# Patient Record
Sex: Female | Born: 1965
Health system: Southern US, Community
[De-identification: ages and names within clinical notes are randomized; demographics above are authoritative.]

## PROBLEM LIST (undated history)

## (undated) DIAGNOSIS — C801 Malignant (primary) neoplasm, unspecified: Secondary | ICD-10-CM

## (undated) DIAGNOSIS — F32A Depression, unspecified: Secondary | ICD-10-CM

## (undated) MED FILL — Vinorelbine Tartrate Inj 50 MG/5ML (10 MG/ML) (Base Equiv): INTRAVENOUS | Qty: 4.5 | Status: AC

---

## 2015-07-19 DIAGNOSIS — C782 Secondary malignant neoplasm of pleura: Secondary | ICD-10-CM | POA: Diagnosis not present

## 2015-07-19 DIAGNOSIS — C50911 Malignant neoplasm of unspecified site of right female breast: Secondary | ICD-10-CM | POA: Diagnosis not present

## 2015-07-19 DIAGNOSIS — C779 Secondary and unspecified malignant neoplasm of lymph node, unspecified: Secondary | ICD-10-CM | POA: Diagnosis not present

## 2015-07-19 DIAGNOSIS — J910 Malignant pleural effusion: Secondary | ICD-10-CM | POA: Diagnosis not present

## 2015-08-16 DIAGNOSIS — C782 Secondary malignant neoplasm of pleura: Secondary | ICD-10-CM | POA: Diagnosis not present

## 2015-08-16 DIAGNOSIS — J910 Malignant pleural effusion: Secondary | ICD-10-CM | POA: Diagnosis not present

## 2015-08-16 DIAGNOSIS — C50911 Malignant neoplasm of unspecified site of right female breast: Secondary | ICD-10-CM | POA: Diagnosis not present

## 2015-11-22 DIAGNOSIS — M79672 Pain in left foot: Secondary | ICD-10-CM

## 2015-11-22 DIAGNOSIS — C779 Secondary and unspecified malignant neoplasm of lymph node, unspecified: Secondary | ICD-10-CM | POA: Diagnosis not present

## 2015-11-22 DIAGNOSIS — G620 Drug-induced polyneuropathy: Secondary | ICD-10-CM

## 2015-11-22 DIAGNOSIS — M79675 Pain in left toe(s): Secondary | ICD-10-CM

## 2015-11-22 DIAGNOSIS — M25572 Pain in left ankle and joints of left foot: Secondary | ICD-10-CM

## 2015-11-22 DIAGNOSIS — G8929 Other chronic pain: Secondary | ICD-10-CM

## 2015-11-22 DIAGNOSIS — M25571 Pain in right ankle and joints of right foot: Secondary | ICD-10-CM

## 2015-11-22 DIAGNOSIS — M79671 Pain in right foot: Secondary | ICD-10-CM

## 2015-11-22 DIAGNOSIS — M79674 Pain in right toe(s): Secondary | ICD-10-CM

## 2015-11-22 DIAGNOSIS — C782 Secondary malignant neoplasm of pleura: Secondary | ICD-10-CM | POA: Diagnosis not present

## 2015-11-22 DIAGNOSIS — C50919 Malignant neoplasm of unspecified site of unspecified female breast: Secondary | ICD-10-CM | POA: Diagnosis not present

## 2015-11-22 DIAGNOSIS — D701 Agranulocytosis secondary to cancer chemotherapy: Secondary | ICD-10-CM

## 2015-11-22 DIAGNOSIS — J910 Malignant pleural effusion: Secondary | ICD-10-CM | POA: Diagnosis not present

## 2015-12-21 DIAGNOSIS — Z853 Personal history of malignant neoplasm of breast: Secondary | ICD-10-CM | POA: Diagnosis not present

## 2015-12-21 DIAGNOSIS — C779 Secondary and unspecified malignant neoplasm of lymph node, unspecified: Secondary | ICD-10-CM | POA: Diagnosis not present

## 2015-12-21 DIAGNOSIS — E876 Hypokalemia: Secondary | ICD-10-CM | POA: Diagnosis not present

## 2015-12-21 DIAGNOSIS — J910 Malignant pleural effusion: Secondary | ICD-10-CM | POA: Diagnosis not present

## 2016-01-19 DIAGNOSIS — C50911 Malignant neoplasm of unspecified site of right female breast: Secondary | ICD-10-CM | POA: Diagnosis not present

## 2016-04-13 DIAGNOSIS — G620 Drug-induced polyneuropathy: Secondary | ICD-10-CM | POA: Diagnosis not present

## 2016-04-13 DIAGNOSIS — D72829 Elevated white blood cell count, unspecified: Secondary | ICD-10-CM | POA: Diagnosis not present

## 2016-04-13 DIAGNOSIS — F418 Other specified anxiety disorders: Secondary | ICD-10-CM

## 2016-04-13 DIAGNOSIS — D518 Other vitamin B12 deficiency anemias: Secondary | ICD-10-CM | POA: Diagnosis not present

## 2016-04-13 DIAGNOSIS — R7989 Other specified abnormal findings of blood chemistry: Secondary | ICD-10-CM

## 2016-04-13 DIAGNOSIS — C50911 Malignant neoplasm of unspecified site of right female breast: Secondary | ICD-10-CM | POA: Diagnosis not present

## 2016-07-20 DIAGNOSIS — E538 Deficiency of other specified B group vitamins: Secondary | ICD-10-CM | POA: Diagnosis not present

## 2016-07-20 DIAGNOSIS — C782 Secondary malignant neoplasm of pleura: Secondary | ICD-10-CM | POA: Diagnosis not present

## 2016-07-20 DIAGNOSIS — C50911 Malignant neoplasm of unspecified site of right female breast: Secondary | ICD-10-CM | POA: Diagnosis not present

## 2016-08-17 DIAGNOSIS — E538 Deficiency of other specified B group vitamins: Secondary | ICD-10-CM | POA: Diagnosis not present

## 2016-08-17 DIAGNOSIS — D709 Neutropenia, unspecified: Secondary | ICD-10-CM | POA: Diagnosis not present

## 2016-08-17 DIAGNOSIS — D72819 Decreased white blood cell count, unspecified: Secondary | ICD-10-CM | POA: Diagnosis not present

## 2016-08-17 DIAGNOSIS — Z853 Personal history of malignant neoplasm of breast: Secondary | ICD-10-CM | POA: Diagnosis not present

## 2016-10-23 DIAGNOSIS — E538 Deficiency of other specified B group vitamins: Secondary | ICD-10-CM | POA: Diagnosis not present

## 2016-11-13 DIAGNOSIS — C778 Secondary and unspecified malignant neoplasm of lymph nodes of multiple regions: Secondary | ICD-10-CM | POA: Diagnosis not present

## 2016-11-13 DIAGNOSIS — M8580 Other specified disorders of bone density and structure, unspecified site: Secondary | ICD-10-CM | POA: Diagnosis not present

## 2016-11-13 DIAGNOSIS — C782 Secondary malignant neoplasm of pleura: Secondary | ICD-10-CM | POA: Diagnosis not present

## 2016-11-13 DIAGNOSIS — E538 Deficiency of other specified B group vitamins: Secondary | ICD-10-CM | POA: Diagnosis not present

## 2016-11-13 DIAGNOSIS — E876 Hypokalemia: Secondary | ICD-10-CM | POA: Diagnosis not present

## 2016-11-13 DIAGNOSIS — C50411 Malignant neoplasm of upper-outer quadrant of right female breast: Secondary | ICD-10-CM | POA: Diagnosis not present

## 2016-11-13 DIAGNOSIS — F17210 Nicotine dependence, cigarettes, uncomplicated: Secondary | ICD-10-CM | POA: Diagnosis not present

## 2016-11-13 DIAGNOSIS — C7989 Secondary malignant neoplasm of other specified sites: Secondary | ICD-10-CM | POA: Diagnosis not present

## 2016-11-13 DIAGNOSIS — D708 Other neutropenia: Secondary | ICD-10-CM | POA: Diagnosis not present

## 2016-11-23 DIAGNOSIS — I10 Essential (primary) hypertension: Secondary | ICD-10-CM | POA: Diagnosis not present

## 2016-11-23 DIAGNOSIS — E039 Hypothyroidism, unspecified: Secondary | ICD-10-CM | POA: Diagnosis not present

## 2016-11-23 DIAGNOSIS — E1165 Type 2 diabetes mellitus with hyperglycemia: Secondary | ICD-10-CM | POA: Diagnosis not present

## 2016-11-23 DIAGNOSIS — E790 Hyperuricemia without signs of inflammatory arthritis and tophaceous disease: Secondary | ICD-10-CM | POA: Diagnosis not present

## 2016-11-23 DIAGNOSIS — E559 Vitamin D deficiency, unspecified: Secondary | ICD-10-CM | POA: Diagnosis not present

## 2016-11-23 DIAGNOSIS — E782 Mixed hyperlipidemia: Secondary | ICD-10-CM | POA: Diagnosis not present

## 2016-11-26 DIAGNOSIS — C384 Malignant neoplasm of pleura: Secondary | ICD-10-CM | POA: Diagnosis not present

## 2016-12-12 DIAGNOSIS — C50411 Malignant neoplasm of upper-outer quadrant of right female breast: Secondary | ICD-10-CM | POA: Diagnosis not present

## 2016-12-12 DIAGNOSIS — E538 Deficiency of other specified B group vitamins: Secondary | ICD-10-CM | POA: Diagnosis not present

## 2016-12-12 DIAGNOSIS — C782 Secondary malignant neoplasm of pleura: Secondary | ICD-10-CM | POA: Diagnosis not present

## 2016-12-12 DIAGNOSIS — C778 Secondary and unspecified malignant neoplasm of lymph nodes of multiple regions: Secondary | ICD-10-CM | POA: Diagnosis not present

## 2016-12-12 DIAGNOSIS — C7989 Secondary malignant neoplasm of other specified sites: Secondary | ICD-10-CM | POA: Diagnosis not present

## 2017-01-07 DIAGNOSIS — C782 Secondary malignant neoplasm of pleura: Secondary | ICD-10-CM | POA: Diagnosis not present

## 2017-01-07 DIAGNOSIS — E538 Deficiency of other specified B group vitamins: Secondary | ICD-10-CM | POA: Diagnosis not present

## 2017-01-07 DIAGNOSIS — C778 Secondary and unspecified malignant neoplasm of lymph nodes of multiple regions: Secondary | ICD-10-CM | POA: Diagnosis not present

## 2017-01-07 DIAGNOSIS — C7989 Secondary malignant neoplasm of other specified sites: Secondary | ICD-10-CM | POA: Diagnosis not present

## 2017-01-07 DIAGNOSIS — C50411 Malignant neoplasm of upper-outer quadrant of right female breast: Secondary | ICD-10-CM | POA: Diagnosis not present

## 2017-02-01 DIAGNOSIS — I700 Atherosclerosis of aorta: Secondary | ICD-10-CM | POA: Diagnosis not present

## 2017-02-01 DIAGNOSIS — C50411 Malignant neoplasm of upper-outer quadrant of right female breast: Secondary | ICD-10-CM | POA: Diagnosis not present

## 2017-02-01 DIAGNOSIS — J984 Other disorders of lung: Secondary | ICD-10-CM | POA: Diagnosis not present

## 2017-02-01 DIAGNOSIS — N281 Cyst of kidney, acquired: Secondary | ICD-10-CM | POA: Diagnosis not present

## 2017-02-01 DIAGNOSIS — C50919 Malignant neoplasm of unspecified site of unspecified female breast: Secondary | ICD-10-CM | POA: Diagnosis not present

## 2017-02-04 DIAGNOSIS — J329 Chronic sinusitis, unspecified: Secondary | ICD-10-CM | POA: Diagnosis not present

## 2017-02-04 DIAGNOSIS — D709 Neutropenia, unspecified: Secondary | ICD-10-CM | POA: Diagnosis not present

## 2017-02-04 DIAGNOSIS — D701 Agranulocytosis secondary to cancer chemotherapy: Secondary | ICD-10-CM | POA: Diagnosis not present

## 2017-02-04 DIAGNOSIS — E538 Deficiency of other specified B group vitamins: Secondary | ICD-10-CM | POA: Diagnosis not present

## 2017-02-04 DIAGNOSIS — J019 Acute sinusitis, unspecified: Secondary | ICD-10-CM | POA: Diagnosis not present

## 2017-02-04 DIAGNOSIS — C50411 Malignant neoplasm of upper-outer quadrant of right female breast: Secondary | ICD-10-CM | POA: Diagnosis not present

## 2017-02-04 DIAGNOSIS — F17210 Nicotine dependence, cigarettes, uncomplicated: Secondary | ICD-10-CM | POA: Diagnosis not present

## 2017-02-04 DIAGNOSIS — C778 Secondary and unspecified malignant neoplasm of lymph nodes of multiple regions: Secondary | ICD-10-CM | POA: Diagnosis not present

## 2017-02-04 DIAGNOSIS — C782 Secondary malignant neoplasm of pleura: Secondary | ICD-10-CM | POA: Diagnosis not present

## 2017-02-04 DIAGNOSIS — Z170 Estrogen receptor positive status [ER+]: Secondary | ICD-10-CM | POA: Diagnosis not present

## 2017-02-04 DIAGNOSIS — C7989 Secondary malignant neoplasm of other specified sites: Secondary | ICD-10-CM | POA: Diagnosis not present

## 2017-02-04 DIAGNOSIS — G8929 Other chronic pain: Secondary | ICD-10-CM | POA: Diagnosis not present

## 2017-02-04 DIAGNOSIS — C50919 Malignant neoplasm of unspecified site of unspecified female breast: Secondary | ICD-10-CM | POA: Diagnosis not present

## 2017-02-04 DIAGNOSIS — E876 Hypokalemia: Secondary | ICD-10-CM | POA: Diagnosis not present

## 2017-02-04 DIAGNOSIS — M8580 Other specified disorders of bone density and structure, unspecified site: Secondary | ICD-10-CM | POA: Diagnosis not present

## 2017-03-05 DIAGNOSIS — E538 Deficiency of other specified B group vitamins: Secondary | ICD-10-CM | POA: Diagnosis not present

## 2017-04-11 DIAGNOSIS — C50411 Malignant neoplasm of upper-outer quadrant of right female breast: Secondary | ICD-10-CM | POA: Diagnosis not present

## 2017-04-11 DIAGNOSIS — E538 Deficiency of other specified B group vitamins: Secondary | ICD-10-CM | POA: Diagnosis not present

## 2017-04-11 DIAGNOSIS — C7989 Secondary malignant neoplasm of other specified sites: Secondary | ICD-10-CM | POA: Diagnosis not present

## 2017-04-11 DIAGNOSIS — C782 Secondary malignant neoplasm of pleura: Secondary | ICD-10-CM | POA: Diagnosis not present

## 2017-04-11 DIAGNOSIS — C778 Secondary and unspecified malignant neoplasm of lymph nodes of multiple regions: Secondary | ICD-10-CM | POA: Diagnosis not present

## 2017-04-25 DIAGNOSIS — E1165 Type 2 diabetes mellitus with hyperglycemia: Secondary | ICD-10-CM | POA: Diagnosis not present

## 2017-04-25 DIAGNOSIS — E782 Mixed hyperlipidemia: Secondary | ICD-10-CM | POA: Diagnosis not present

## 2017-04-25 DIAGNOSIS — E559 Vitamin D deficiency, unspecified: Secondary | ICD-10-CM | POA: Diagnosis not present

## 2017-04-25 DIAGNOSIS — E039 Hypothyroidism, unspecified: Secondary | ICD-10-CM | POA: Diagnosis not present

## 2017-04-25 DIAGNOSIS — E790 Hyperuricemia without signs of inflammatory arthritis and tophaceous disease: Secondary | ICD-10-CM | POA: Diagnosis not present

## 2017-04-25 DIAGNOSIS — I10 Essential (primary) hypertension: Secondary | ICD-10-CM | POA: Diagnosis not present

## 2017-04-26 DIAGNOSIS — F419 Anxiety disorder, unspecified: Secondary | ICD-10-CM | POA: Diagnosis not present

## 2017-04-26 DIAGNOSIS — F332 Major depressive disorder, recurrent severe without psychotic features: Secondary | ICD-10-CM | POA: Diagnosis not present

## 2017-04-26 DIAGNOSIS — M79609 Pain in unspecified limb: Secondary | ICD-10-CM | POA: Diagnosis not present

## 2017-04-26 DIAGNOSIS — F339 Major depressive disorder, recurrent, unspecified: Secondary | ICD-10-CM | POA: Diagnosis not present

## 2017-04-29 DIAGNOSIS — E538 Deficiency of other specified B group vitamins: Secondary | ICD-10-CM | POA: Diagnosis not present

## 2017-04-29 DIAGNOSIS — C782 Secondary malignant neoplasm of pleura: Secondary | ICD-10-CM | POA: Diagnosis not present

## 2017-04-29 DIAGNOSIS — C50411 Malignant neoplasm of upper-outer quadrant of right female breast: Secondary | ICD-10-CM | POA: Diagnosis not present

## 2017-04-29 DIAGNOSIS — C7989 Secondary malignant neoplasm of other specified sites: Secondary | ICD-10-CM | POA: Diagnosis not present

## 2017-04-29 DIAGNOSIS — C778 Secondary and unspecified malignant neoplasm of lymph nodes of multiple regions: Secondary | ICD-10-CM | POA: Diagnosis not present

## 2017-06-05 DIAGNOSIS — C50411 Malignant neoplasm of upper-outer quadrant of right female breast: Secondary | ICD-10-CM | POA: Diagnosis not present

## 2017-06-05 DIAGNOSIS — E538 Deficiency of other specified B group vitamins: Secondary | ICD-10-CM | POA: Diagnosis not present

## 2017-06-05 DIAGNOSIS — C782 Secondary malignant neoplasm of pleura: Secondary | ICD-10-CM | POA: Diagnosis not present

## 2017-06-05 DIAGNOSIS — C7989 Secondary malignant neoplasm of other specified sites: Secondary | ICD-10-CM | POA: Diagnosis not present

## 2017-06-05 DIAGNOSIS — C778 Secondary and unspecified malignant neoplasm of lymph nodes of multiple regions: Secondary | ICD-10-CM | POA: Diagnosis not present

## 2017-07-03 DIAGNOSIS — C50411 Malignant neoplasm of upper-outer quadrant of right female breast: Secondary | ICD-10-CM | POA: Diagnosis not present

## 2017-07-03 DIAGNOSIS — E538 Deficiency of other specified B group vitamins: Secondary | ICD-10-CM | POA: Diagnosis not present

## 2017-07-03 DIAGNOSIS — C7989 Secondary malignant neoplasm of other specified sites: Secondary | ICD-10-CM | POA: Diagnosis not present

## 2017-07-03 DIAGNOSIS — C782 Secondary malignant neoplasm of pleura: Secondary | ICD-10-CM | POA: Diagnosis not present

## 2017-07-03 DIAGNOSIS — Z23 Encounter for immunization: Secondary | ICD-10-CM | POA: Diagnosis not present

## 2017-07-03 DIAGNOSIS — C778 Secondary and unspecified malignant neoplasm of lymph nodes of multiple regions: Secondary | ICD-10-CM | POA: Diagnosis not present

## 2017-07-19 DIAGNOSIS — C384 Malignant neoplasm of pleura: Secondary | ICD-10-CM | POA: Diagnosis not present

## 2017-07-19 DIAGNOSIS — K760 Fatty (change of) liver, not elsewhere classified: Secondary | ICD-10-CM | POA: Diagnosis not present

## 2017-07-19 DIAGNOSIS — C50911 Malignant neoplasm of unspecified site of right female breast: Secondary | ICD-10-CM | POA: Diagnosis not present

## 2017-07-19 DIAGNOSIS — C50411 Malignant neoplasm of upper-outer quadrant of right female breast: Secondary | ICD-10-CM | POA: Diagnosis not present

## 2017-07-22 DIAGNOSIS — D72818 Other decreased white blood cell count: Secondary | ICD-10-CM | POA: Diagnosis not present

## 2017-07-22 DIAGNOSIS — C782 Secondary malignant neoplasm of pleura: Secondary | ICD-10-CM | POA: Diagnosis not present

## 2017-07-22 DIAGNOSIS — C778 Secondary and unspecified malignant neoplasm of lymph nodes of multiple regions: Secondary | ICD-10-CM | POA: Diagnosis not present

## 2017-07-22 DIAGNOSIS — D709 Neutropenia, unspecified: Secondary | ICD-10-CM | POA: Diagnosis not present

## 2017-07-22 DIAGNOSIS — D708 Other neutropenia: Secondary | ICD-10-CM | POA: Diagnosis not present

## 2017-07-22 DIAGNOSIS — C779 Secondary and unspecified malignant neoplasm of lymph node, unspecified: Secondary | ICD-10-CM | POA: Diagnosis not present

## 2017-07-22 DIAGNOSIS — D72819 Decreased white blood cell count, unspecified: Secondary | ICD-10-CM | POA: Diagnosis not present

## 2017-07-22 DIAGNOSIS — C50411 Malignant neoplasm of upper-outer quadrant of right female breast: Secondary | ICD-10-CM | POA: Diagnosis not present

## 2017-07-22 DIAGNOSIS — Z853 Personal history of malignant neoplasm of breast: Secondary | ICD-10-CM | POA: Diagnosis not present

## 2017-07-22 DIAGNOSIS — E538 Deficiency of other specified B group vitamins: Secondary | ICD-10-CM | POA: Diagnosis not present

## 2017-08-15 DIAGNOSIS — E538 Deficiency of other specified B group vitamins: Secondary | ICD-10-CM | POA: Diagnosis not present

## 2017-08-26 DIAGNOSIS — E790 Hyperuricemia without signs of inflammatory arthritis and tophaceous disease: Secondary | ICD-10-CM | POA: Diagnosis not present

## 2017-08-26 DIAGNOSIS — E1165 Type 2 diabetes mellitus with hyperglycemia: Secondary | ICD-10-CM | POA: Diagnosis not present

## 2017-08-26 DIAGNOSIS — E039 Hypothyroidism, unspecified: Secondary | ICD-10-CM | POA: Diagnosis not present

## 2017-08-26 DIAGNOSIS — I10 Essential (primary) hypertension: Secondary | ICD-10-CM | POA: Diagnosis not present

## 2017-08-26 DIAGNOSIS — E559 Vitamin D deficiency, unspecified: Secondary | ICD-10-CM | POA: Diagnosis not present

## 2017-08-26 DIAGNOSIS — E782 Mixed hyperlipidemia: Secondary | ICD-10-CM | POA: Diagnosis not present

## 2017-09-02 DIAGNOSIS — J019 Acute sinusitis, unspecified: Secondary | ICD-10-CM | POA: Diagnosis not present

## 2017-09-02 DIAGNOSIS — F17200 Nicotine dependence, unspecified, uncomplicated: Secondary | ICD-10-CM | POA: Diagnosis not present

## 2017-09-02 DIAGNOSIS — D709 Neutropenia, unspecified: Secondary | ICD-10-CM | POA: Diagnosis not present

## 2017-09-02 DIAGNOSIS — Z515 Encounter for palliative care: Secondary | ICD-10-CM | POA: Diagnosis not present

## 2017-09-02 DIAGNOSIS — C778 Secondary and unspecified malignant neoplasm of lymph nodes of multiple regions: Secondary | ICD-10-CM | POA: Diagnosis not present

## 2017-09-02 DIAGNOSIS — C50411 Malignant neoplasm of upper-outer quadrant of right female breast: Secondary | ICD-10-CM | POA: Diagnosis not present

## 2017-09-02 DIAGNOSIS — C782 Secondary malignant neoplasm of pleura: Secondary | ICD-10-CM | POA: Diagnosis not present

## 2017-09-16 DIAGNOSIS — C7989 Secondary malignant neoplasm of other specified sites: Secondary | ICD-10-CM | POA: Diagnosis not present

## 2017-09-16 DIAGNOSIS — C782 Secondary malignant neoplasm of pleura: Secondary | ICD-10-CM | POA: Diagnosis not present

## 2017-09-16 DIAGNOSIS — E538 Deficiency of other specified B group vitamins: Secondary | ICD-10-CM | POA: Diagnosis not present

## 2017-09-16 DIAGNOSIS — C778 Secondary and unspecified malignant neoplasm of lymph nodes of multiple regions: Secondary | ICD-10-CM | POA: Diagnosis not present

## 2017-09-16 DIAGNOSIS — C50411 Malignant neoplasm of upper-outer quadrant of right female breast: Secondary | ICD-10-CM | POA: Diagnosis not present

## 2017-10-17 DIAGNOSIS — I10 Essential (primary) hypertension: Secondary | ICD-10-CM | POA: Diagnosis not present

## 2017-10-17 DIAGNOSIS — F17210 Nicotine dependence, cigarettes, uncomplicated: Secondary | ICD-10-CM | POA: Diagnosis not present

## 2017-10-17 DIAGNOSIS — Z5181 Encounter for therapeutic drug level monitoring: Secondary | ICD-10-CM | POA: Diagnosis not present

## 2017-10-17 DIAGNOSIS — F331 Major depressive disorder, recurrent, moderate: Secondary | ICD-10-CM | POA: Diagnosis not present

## 2017-10-17 DIAGNOSIS — F411 Generalized anxiety disorder: Secondary | ICD-10-CM | POA: Diagnosis not present

## 2017-10-31 DIAGNOSIS — C50411 Malignant neoplasm of upper-outer quadrant of right female breast: Secondary | ICD-10-CM | POA: Diagnosis not present

## 2017-10-31 DIAGNOSIS — C384 Malignant neoplasm of pleura: Secondary | ICD-10-CM | POA: Diagnosis not present

## 2017-11-25 DIAGNOSIS — I10 Essential (primary) hypertension: Secondary | ICD-10-CM | POA: Diagnosis not present

## 2017-11-25 DIAGNOSIS — F17210 Nicotine dependence, cigarettes, uncomplicated: Secondary | ICD-10-CM | POA: Diagnosis not present

## 2017-11-25 DIAGNOSIS — Z5181 Encounter for therapeutic drug level monitoring: Secondary | ICD-10-CM | POA: Diagnosis not present

## 2017-11-25 DIAGNOSIS — F411 Generalized anxiety disorder: Secondary | ICD-10-CM | POA: Diagnosis not present

## 2017-11-28 DIAGNOSIS — C50411 Malignant neoplasm of upper-outer quadrant of right female breast: Secondary | ICD-10-CM | POA: Diagnosis not present

## 2017-11-28 DIAGNOSIS — C782 Secondary malignant neoplasm of pleura: Secondary | ICD-10-CM | POA: Diagnosis not present

## 2017-11-28 DIAGNOSIS — E538 Deficiency of other specified B group vitamins: Secondary | ICD-10-CM | POA: Diagnosis not present

## 2017-11-28 DIAGNOSIS — C778 Secondary and unspecified malignant neoplasm of lymph nodes of multiple regions: Secondary | ICD-10-CM | POA: Diagnosis not present

## 2017-11-28 DIAGNOSIS — C7989 Secondary malignant neoplasm of other specified sites: Secondary | ICD-10-CM | POA: Diagnosis not present

## 2017-12-23 DIAGNOSIS — M8589 Other specified disorders of bone density and structure, multiple sites: Secondary | ICD-10-CM | POA: Diagnosis not present

## 2017-12-24 DIAGNOSIS — F411 Generalized anxiety disorder: Secondary | ICD-10-CM | POA: Diagnosis not present

## 2017-12-24 DIAGNOSIS — C801 Malignant (primary) neoplasm, unspecified: Secondary | ICD-10-CM | POA: Diagnosis not present

## 2017-12-24 DIAGNOSIS — I10 Essential (primary) hypertension: Secondary | ICD-10-CM | POA: Diagnosis not present

## 2017-12-24 DIAGNOSIS — Z5181 Encounter for therapeutic drug level monitoring: Secondary | ICD-10-CM | POA: Diagnosis not present

## 2017-12-26 DIAGNOSIS — C384 Malignant neoplasm of pleura: Secondary | ICD-10-CM | POA: Diagnosis not present

## 2017-12-26 DIAGNOSIS — C782 Secondary malignant neoplasm of pleura: Secondary | ICD-10-CM | POA: Diagnosis not present

## 2017-12-26 DIAGNOSIS — E782 Mixed hyperlipidemia: Secondary | ICD-10-CM | POA: Diagnosis not present

## 2017-12-26 DIAGNOSIS — Z130 Encounter for screening for diseases of the blood and blood-forming organs and certain disorders involving the immune mechanism: Secondary | ICD-10-CM | POA: Diagnosis not present

## 2017-12-26 DIAGNOSIS — C7989 Secondary malignant neoplasm of other specified sites: Secondary | ICD-10-CM | POA: Diagnosis not present

## 2017-12-26 DIAGNOSIS — E559 Vitamin D deficiency, unspecified: Secondary | ICD-10-CM | POA: Diagnosis not present

## 2017-12-26 DIAGNOSIS — C778 Secondary and unspecified malignant neoplasm of lymph nodes of multiple regions: Secondary | ICD-10-CM | POA: Diagnosis not present

## 2017-12-26 DIAGNOSIS — E538 Deficiency of other specified B group vitamins: Secondary | ICD-10-CM | POA: Diagnosis not present

## 2017-12-26 DIAGNOSIS — C50411 Malignant neoplasm of upper-outer quadrant of right female breast: Secondary | ICD-10-CM | POA: Diagnosis not present

## 2018-01-10 DIAGNOSIS — Z9011 Acquired absence of right breast and nipple: Secondary | ICD-10-CM | POA: Diagnosis not present

## 2018-01-10 DIAGNOSIS — R928 Other abnormal and inconclusive findings on diagnostic imaging of breast: Secondary | ICD-10-CM | POA: Diagnosis not present

## 2018-01-10 DIAGNOSIS — Z1231 Encounter for screening mammogram for malignant neoplasm of breast: Secondary | ICD-10-CM | POA: Diagnosis not present

## 2018-01-22 DIAGNOSIS — N6320 Unspecified lump in the left breast, unspecified quadrant: Secondary | ICD-10-CM | POA: Diagnosis not present

## 2018-01-22 DIAGNOSIS — N6489 Other specified disorders of breast: Secondary | ICD-10-CM | POA: Diagnosis not present

## 2018-01-23 DIAGNOSIS — Z5181 Encounter for therapeutic drug level monitoring: Secondary | ICD-10-CM | POA: Diagnosis not present

## 2018-01-23 DIAGNOSIS — F17210 Nicotine dependence, cigarettes, uncomplicated: Secondary | ICD-10-CM | POA: Diagnosis not present

## 2018-01-23 DIAGNOSIS — I10 Essential (primary) hypertension: Secondary | ICD-10-CM | POA: Diagnosis not present

## 2018-01-23 DIAGNOSIS — F411 Generalized anxiety disorder: Secondary | ICD-10-CM | POA: Diagnosis not present

## 2018-01-29 DIAGNOSIS — C782 Secondary malignant neoplasm of pleura: Secondary | ICD-10-CM | POA: Diagnosis not present

## 2018-01-29 DIAGNOSIS — C7989 Secondary malignant neoplasm of other specified sites: Secondary | ICD-10-CM | POA: Diagnosis not present

## 2018-01-29 DIAGNOSIS — E538 Deficiency of other specified B group vitamins: Secondary | ICD-10-CM | POA: Diagnosis not present

## 2018-01-29 DIAGNOSIS — C50411 Malignant neoplasm of upper-outer quadrant of right female breast: Secondary | ICD-10-CM | POA: Diagnosis not present

## 2018-01-29 DIAGNOSIS — C778 Secondary and unspecified malignant neoplasm of lymph nodes of multiple regions: Secondary | ICD-10-CM | POA: Diagnosis not present

## 2018-01-29 DIAGNOSIS — C384 Malignant neoplasm of pleura: Secondary | ICD-10-CM | POA: Diagnosis not present

## 2018-03-06 DIAGNOSIS — N6489 Other specified disorders of breast: Secondary | ICD-10-CM | POA: Diagnosis not present

## 2018-03-06 DIAGNOSIS — C778 Secondary and unspecified malignant neoplasm of lymph nodes of multiple regions: Secondary | ICD-10-CM | POA: Diagnosis not present

## 2018-03-06 DIAGNOSIS — C782 Secondary malignant neoplasm of pleura: Secondary | ICD-10-CM | POA: Diagnosis not present

## 2018-03-06 DIAGNOSIS — F17210 Nicotine dependence, cigarettes, uncomplicated: Secondary | ICD-10-CM | POA: Diagnosis not present

## 2018-03-06 DIAGNOSIS — B370 Candidal stomatitis: Secondary | ICD-10-CM | POA: Diagnosis not present

## 2018-03-06 DIAGNOSIS — Z720 Tobacco use: Secondary | ICD-10-CM | POA: Diagnosis not present

## 2018-03-06 DIAGNOSIS — E538 Deficiency of other specified B group vitamins: Secondary | ICD-10-CM | POA: Diagnosis not present

## 2018-03-06 DIAGNOSIS — C50911 Malignant neoplasm of unspecified site of right female breast: Secondary | ICD-10-CM | POA: Diagnosis not present

## 2018-03-06 DIAGNOSIS — M8580 Other specified disorders of bone density and structure, unspecified site: Secondary | ICD-10-CM | POA: Diagnosis not present

## 2018-03-06 DIAGNOSIS — C50411 Malignant neoplasm of upper-outer quadrant of right female breast: Secondary | ICD-10-CM | POA: Diagnosis not present

## 2018-03-06 DIAGNOSIS — Z79811 Long term (current) use of aromatase inhibitors: Secondary | ICD-10-CM | POA: Diagnosis not present

## 2018-03-06 DIAGNOSIS — D701 Agranulocytosis secondary to cancer chemotherapy: Secondary | ICD-10-CM | POA: Diagnosis not present

## 2018-03-06 DIAGNOSIS — C7989 Secondary malignant neoplasm of other specified sites: Secondary | ICD-10-CM | POA: Diagnosis not present

## 2018-03-06 DIAGNOSIS — M8589 Other specified disorders of bone density and structure, multiple sites: Secondary | ICD-10-CM | POA: Diagnosis not present

## 2018-03-06 DIAGNOSIS — D72819 Decreased white blood cell count, unspecified: Secondary | ICD-10-CM | POA: Diagnosis not present

## 2018-03-06 DIAGNOSIS — E876 Hypokalemia: Secondary | ICD-10-CM | POA: Diagnosis not present

## 2018-03-06 DIAGNOSIS — F418 Other specified anxiety disorders: Secondary | ICD-10-CM | POA: Diagnosis not present

## 2018-04-08 DIAGNOSIS — C50411 Malignant neoplasm of upper-outer quadrant of right female breast: Secondary | ICD-10-CM | POA: Diagnosis not present

## 2018-04-08 DIAGNOSIS — E538 Deficiency of other specified B group vitamins: Secondary | ICD-10-CM | POA: Diagnosis not present

## 2018-06-03 DIAGNOSIS — E538 Deficiency of other specified B group vitamins: Secondary | ICD-10-CM | POA: Diagnosis not present

## 2018-06-03 DIAGNOSIS — C50411 Malignant neoplasm of upper-outer quadrant of right female breast: Secondary | ICD-10-CM | POA: Diagnosis not present

## 2018-07-07 DIAGNOSIS — F418 Other specified anxiety disorders: Secondary | ICD-10-CM | POA: Diagnosis not present

## 2018-07-07 DIAGNOSIS — F17210 Nicotine dependence, cigarettes, uncomplicated: Secondary | ICD-10-CM | POA: Diagnosis not present

## 2018-07-07 DIAGNOSIS — R002 Palpitations: Secondary | ICD-10-CM | POA: Diagnosis not present

## 2018-07-07 DIAGNOSIS — F419 Anxiety disorder, unspecified: Secondary | ICD-10-CM | POA: Diagnosis not present

## 2018-07-07 DIAGNOSIS — R000 Tachycardia, unspecified: Secondary | ICD-10-CM | POA: Diagnosis not present

## 2018-07-09 DIAGNOSIS — E559 Vitamin D deficiency, unspecified: Secondary | ICD-10-CM | POA: Diagnosis not present

## 2018-07-09 DIAGNOSIS — E785 Hyperlipidemia, unspecified: Secondary | ICD-10-CM | POA: Diagnosis not present

## 2018-07-09 DIAGNOSIS — I10 Essential (primary) hypertension: Secondary | ICD-10-CM | POA: Diagnosis not present

## 2018-07-09 DIAGNOSIS — K219 Gastro-esophageal reflux disease without esophagitis: Secondary | ICD-10-CM | POA: Diagnosis not present

## 2018-07-09 DIAGNOSIS — R5382 Chronic fatigue, unspecified: Secondary | ICD-10-CM | POA: Diagnosis not present

## 2018-07-09 DIAGNOSIS — Z23 Encounter for immunization: Secondary | ICD-10-CM | POA: Diagnosis not present

## 2018-07-14 DIAGNOSIS — F418 Other specified anxiety disorders: Secondary | ICD-10-CM | POA: Diagnosis not present

## 2018-07-14 DIAGNOSIS — F17210 Nicotine dependence, cigarettes, uncomplicated: Secondary | ICD-10-CM | POA: Diagnosis not present

## 2018-07-14 DIAGNOSIS — C778 Secondary and unspecified malignant neoplasm of lymph nodes of multiple regions: Secondary | ICD-10-CM | POA: Diagnosis not present

## 2018-07-14 DIAGNOSIS — I10 Essential (primary) hypertension: Secondary | ICD-10-CM | POA: Diagnosis not present

## 2018-07-14 DIAGNOSIS — C782 Secondary malignant neoplasm of pleura: Secondary | ICD-10-CM | POA: Diagnosis not present

## 2018-07-14 DIAGNOSIS — D708 Other neutropenia: Secondary | ICD-10-CM | POA: Diagnosis not present

## 2018-07-14 DIAGNOSIS — D72819 Decreased white blood cell count, unspecified: Secondary | ICD-10-CM | POA: Diagnosis not present

## 2018-07-14 DIAGNOSIS — R978 Other abnormal tumor markers: Secondary | ICD-10-CM | POA: Diagnosis not present

## 2018-07-14 DIAGNOSIS — E538 Deficiency of other specified B group vitamins: Secondary | ICD-10-CM | POA: Diagnosis not present

## 2018-07-14 DIAGNOSIS — C50411 Malignant neoplasm of upper-outer quadrant of right female breast: Secondary | ICD-10-CM | POA: Diagnosis not present

## 2018-07-14 DIAGNOSIS — C7989 Secondary malignant neoplasm of other specified sites: Secondary | ICD-10-CM | POA: Diagnosis not present

## 2018-07-21 DIAGNOSIS — Z23 Encounter for immunization: Secondary | ICD-10-CM | POA: Diagnosis not present

## 2018-07-21 DIAGNOSIS — M791 Myalgia, unspecified site: Secondary | ICD-10-CM | POA: Diagnosis not present

## 2018-07-21 DIAGNOSIS — I10 Essential (primary) hypertension: Secondary | ICD-10-CM | POA: Diagnosis not present

## 2018-07-21 DIAGNOSIS — E559 Vitamin D deficiency, unspecified: Secondary | ICD-10-CM | POA: Diagnosis not present

## 2018-07-21 DIAGNOSIS — E785 Hyperlipidemia, unspecified: Secondary | ICD-10-CM | POA: Diagnosis not present

## 2018-07-23 DIAGNOSIS — N649 Disorder of breast, unspecified: Secondary | ICD-10-CM | POA: Diagnosis not present

## 2018-07-23 DIAGNOSIS — C50911 Malignant neoplasm of unspecified site of right female breast: Secondary | ICD-10-CM | POA: Diagnosis not present

## 2018-07-23 DIAGNOSIS — N6489 Other specified disorders of breast: Secondary | ICD-10-CM | POA: Diagnosis not present

## 2018-07-23 DIAGNOSIS — C50411 Malignant neoplasm of upper-outer quadrant of right female breast: Secondary | ICD-10-CM | POA: Diagnosis not present

## 2018-07-23 DIAGNOSIS — R978 Other abnormal tumor markers: Secondary | ICD-10-CM | POA: Diagnosis not present

## 2018-07-23 DIAGNOSIS — C50912 Malignant neoplasm of unspecified site of left female breast: Secondary | ICD-10-CM | POA: Diagnosis not present

## 2018-07-24 DIAGNOSIS — C50911 Malignant neoplasm of unspecified site of right female breast: Secondary | ICD-10-CM | POA: Diagnosis not present

## 2018-07-24 DIAGNOSIS — Z510 Encounter for antineoplastic radiation therapy: Secondary | ICD-10-CM | POA: Diagnosis not present

## 2018-07-24 DIAGNOSIS — C50411 Malignant neoplasm of upper-outer quadrant of right female breast: Secondary | ICD-10-CM | POA: Diagnosis not present

## 2018-07-29 DIAGNOSIS — Z79899 Other long term (current) drug therapy: Secondary | ICD-10-CM

## 2018-07-29 DIAGNOSIS — C7951 Secondary malignant neoplasm of bone: Secondary | ICD-10-CM | POA: Diagnosis not present

## 2018-07-29 DIAGNOSIS — C778 Secondary and unspecified malignant neoplasm of lymph nodes of multiple regions: Secondary | ICD-10-CM | POA: Diagnosis not present

## 2018-07-29 DIAGNOSIS — D72819 Decreased white blood cell count, unspecified: Secondary | ICD-10-CM | POA: Diagnosis not present

## 2018-07-29 DIAGNOSIS — Z79811 Long term (current) use of aromatase inhibitors: Secondary | ICD-10-CM

## 2018-07-29 DIAGNOSIS — Z720 Tobacco use: Secondary | ICD-10-CM | POA: Diagnosis not present

## 2018-07-29 DIAGNOSIS — E538 Deficiency of other specified B group vitamins: Secondary | ICD-10-CM | POA: Diagnosis not present

## 2018-07-29 DIAGNOSIS — C50411 Malignant neoplasm of upper-outer quadrant of right female breast: Secondary | ICD-10-CM | POA: Diagnosis not present

## 2018-07-29 DIAGNOSIS — D701 Agranulocytosis secondary to cancer chemotherapy: Secondary | ICD-10-CM

## 2018-07-29 DIAGNOSIS — Z9221 Personal history of antineoplastic chemotherapy: Secondary | ICD-10-CM

## 2018-07-29 DIAGNOSIS — I10 Essential (primary) hypertension: Secondary | ICD-10-CM

## 2018-07-29 DIAGNOSIS — D708 Other neutropenia: Secondary | ICD-10-CM | POA: Diagnosis not present

## 2018-07-29 DIAGNOSIS — C782 Secondary malignant neoplasm of pleura: Secondary | ICD-10-CM | POA: Diagnosis not present

## 2018-07-29 DIAGNOSIS — Z923 Personal history of irradiation: Secondary | ICD-10-CM

## 2018-07-29 DIAGNOSIS — F17210 Nicotine dependence, cigarettes, uncomplicated: Secondary | ICD-10-CM | POA: Diagnosis not present

## 2018-07-29 DIAGNOSIS — R7989 Other specified abnormal findings of blood chemistry: Secondary | ICD-10-CM | POA: Diagnosis not present

## 2018-07-29 DIAGNOSIS — D72828 Other elevated white blood cell count: Secondary | ICD-10-CM

## 2018-07-29 DIAGNOSIS — F418 Other specified anxiety disorders: Secondary | ICD-10-CM

## 2018-07-29 DIAGNOSIS — C7989 Secondary malignant neoplasm of other specified sites: Secondary | ICD-10-CM | POA: Diagnosis not present

## 2018-07-29 DIAGNOSIS — C50911 Malignant neoplasm of unspecified site of right female breast: Secondary | ICD-10-CM | POA: Diagnosis not present

## 2018-08-05 DIAGNOSIS — C778 Secondary and unspecified malignant neoplasm of lymph nodes of multiple regions: Secondary | ICD-10-CM | POA: Diagnosis not present

## 2018-08-05 DIAGNOSIS — C7989 Secondary malignant neoplasm of other specified sites: Secondary | ICD-10-CM | POA: Diagnosis not present

## 2018-08-05 DIAGNOSIS — C50411 Malignant neoplasm of upper-outer quadrant of right female breast: Secondary | ICD-10-CM | POA: Diagnosis not present

## 2018-08-11 DIAGNOSIS — Z9221 Personal history of antineoplastic chemotherapy: Secondary | ICD-10-CM

## 2018-08-11 DIAGNOSIS — Z923 Personal history of irradiation: Secondary | ICD-10-CM | POA: Diagnosis not present

## 2018-08-11 DIAGNOSIS — C778 Secondary and unspecified malignant neoplasm of lymph nodes of multiple regions: Secondary | ICD-10-CM | POA: Diagnosis not present

## 2018-08-11 DIAGNOSIS — M8589 Other specified disorders of bone density and structure, multiple sites: Secondary | ICD-10-CM | POA: Diagnosis not present

## 2018-08-11 DIAGNOSIS — Z515 Encounter for palliative care: Secondary | ICD-10-CM | POA: Diagnosis not present

## 2018-08-11 DIAGNOSIS — F17210 Nicotine dependence, cigarettes, uncomplicated: Secondary | ICD-10-CM | POA: Diagnosis not present

## 2018-08-11 DIAGNOSIS — Z79899 Other long term (current) drug therapy: Secondary | ICD-10-CM | POA: Diagnosis not present

## 2018-08-11 DIAGNOSIS — Z79811 Long term (current) use of aromatase inhibitors: Secondary | ICD-10-CM | POA: Diagnosis not present

## 2018-08-11 DIAGNOSIS — Z720 Tobacco use: Secondary | ICD-10-CM

## 2018-08-11 DIAGNOSIS — I10 Essential (primary) hypertension: Secondary | ICD-10-CM | POA: Diagnosis not present

## 2018-08-11 DIAGNOSIS — C50411 Malignant neoplasm of upper-outer quadrant of right female breast: Secondary | ICD-10-CM | POA: Diagnosis not present

## 2018-08-11 DIAGNOSIS — R110 Nausea: Secondary | ICD-10-CM | POA: Diagnosis not present

## 2018-08-11 DIAGNOSIS — C7989 Secondary malignant neoplasm of other specified sites: Secondary | ICD-10-CM | POA: Diagnosis not present

## 2018-08-11 DIAGNOSIS — C50911 Malignant neoplasm of unspecified site of right female breast: Secondary | ICD-10-CM | POA: Diagnosis not present

## 2018-08-11 DIAGNOSIS — C7951 Secondary malignant neoplasm of bone: Secondary | ICD-10-CM | POA: Diagnosis not present

## 2018-08-11 DIAGNOSIS — C782 Secondary malignant neoplasm of pleura: Secondary | ICD-10-CM | POA: Diagnosis not present

## 2018-08-11 DIAGNOSIS — E538 Deficiency of other specified B group vitamins: Secondary | ICD-10-CM | POA: Diagnosis not present

## 2018-08-21 DIAGNOSIS — C7989 Secondary malignant neoplasm of other specified sites: Secondary | ICD-10-CM | POA: Diagnosis not present

## 2018-08-21 DIAGNOSIS — C782 Secondary malignant neoplasm of pleura: Secondary | ICD-10-CM | POA: Diagnosis not present

## 2018-08-21 DIAGNOSIS — C778 Secondary and unspecified malignant neoplasm of lymph nodes of multiple regions: Secondary | ICD-10-CM | POA: Diagnosis not present

## 2018-08-21 DIAGNOSIS — C50411 Malignant neoplasm of upper-outer quadrant of right female breast: Secondary | ICD-10-CM | POA: Diagnosis not present

## 2018-08-21 DIAGNOSIS — E538 Deficiency of other specified B group vitamins: Secondary | ICD-10-CM | POA: Diagnosis not present

## 2018-09-11 DIAGNOSIS — C384 Malignant neoplasm of pleura: Secondary | ICD-10-CM | POA: Diagnosis not present

## 2018-09-11 DIAGNOSIS — R197 Diarrhea, unspecified: Secondary | ICD-10-CM | POA: Diagnosis not present

## 2018-09-11 DIAGNOSIS — I10 Essential (primary) hypertension: Secondary | ICD-10-CM | POA: Diagnosis not present

## 2018-09-11 DIAGNOSIS — F17210 Nicotine dependence, cigarettes, uncomplicated: Secondary | ICD-10-CM | POA: Diagnosis not present

## 2018-09-11 DIAGNOSIS — C7951 Secondary malignant neoplasm of bone: Secondary | ICD-10-CM | POA: Diagnosis not present

## 2018-09-11 DIAGNOSIS — R110 Nausea: Secondary | ICD-10-CM | POA: Diagnosis not present

## 2018-09-11 DIAGNOSIS — R739 Hyperglycemia, unspecified: Secondary | ICD-10-CM | POA: Diagnosis not present

## 2018-09-11 DIAGNOSIS — C50411 Malignant neoplasm of upper-outer quadrant of right female breast: Secondary | ICD-10-CM | POA: Diagnosis not present

## 2018-09-11 DIAGNOSIS — E538 Deficiency of other specified B group vitamins: Secondary | ICD-10-CM | POA: Diagnosis not present

## 2018-09-11 DIAGNOSIS — R300 Dysuria: Secondary | ICD-10-CM | POA: Diagnosis not present

## 2018-09-11 DIAGNOSIS — C778 Secondary and unspecified malignant neoplasm of lymph nodes of multiple regions: Secondary | ICD-10-CM | POA: Diagnosis not present

## 2018-09-12 DIAGNOSIS — I10 Essential (primary) hypertension: Secondary | ICD-10-CM | POA: Diagnosis not present

## 2018-09-12 DIAGNOSIS — E785 Hyperlipidemia, unspecified: Secondary | ICD-10-CM | POA: Diagnosis not present

## 2018-09-12 DIAGNOSIS — M791 Myalgia, unspecified site: Secondary | ICD-10-CM | POA: Diagnosis not present

## 2018-09-12 DIAGNOSIS — E118 Type 2 diabetes mellitus with unspecified complications: Secondary | ICD-10-CM | POA: Diagnosis not present

## 2018-09-14 DIAGNOSIS — Z7984 Long term (current) use of oral hypoglycemic drugs: Secondary | ICD-10-CM | POA: Diagnosis not present

## 2018-09-14 DIAGNOSIS — R945 Abnormal results of liver function studies: Secondary | ICD-10-CM | POA: Diagnosis not present

## 2018-09-14 DIAGNOSIS — S2231XA Fracture of one rib, right side, initial encounter for closed fracture: Secondary | ICD-10-CM | POA: Diagnosis not present

## 2018-09-14 DIAGNOSIS — T424X2A Poisoning by benzodiazepines, intentional self-harm, initial encounter: Secondary | ICD-10-CM | POA: Diagnosis present

## 2018-09-14 DIAGNOSIS — C782 Secondary malignant neoplasm of pleura: Secondary | ICD-10-CM | POA: Diagnosis present

## 2018-09-14 DIAGNOSIS — T481X2A Poisoning by skeletal muscle relaxants [neuromuscular blocking agents], intentional self-harm, initial encounter: Secondary | ICD-10-CM | POA: Diagnosis not present

## 2018-09-14 DIAGNOSIS — C50911 Malignant neoplasm of unspecified site of right female breast: Secondary | ICD-10-CM | POA: Diagnosis not present

## 2018-09-14 DIAGNOSIS — Z9221 Personal history of antineoplastic chemotherapy: Secondary | ICD-10-CM | POA: Diagnosis not present

## 2018-09-14 DIAGNOSIS — C50919 Malignant neoplasm of unspecified site of unspecified female breast: Secondary | ICD-10-CM | POA: Diagnosis not present

## 2018-09-14 DIAGNOSIS — E1165 Type 2 diabetes mellitus with hyperglycemia: Secondary | ICD-10-CM | POA: Diagnosis present

## 2018-09-14 DIAGNOSIS — Z79899 Other long term (current) drug therapy: Secondary | ICD-10-CM | POA: Diagnosis not present

## 2018-09-14 DIAGNOSIS — F410 Panic disorder [episodic paroxysmal anxiety] without agoraphobia: Secondary | ICD-10-CM | POA: Diagnosis not present

## 2018-09-14 DIAGNOSIS — R1011 Right upper quadrant pain: Secondary | ICD-10-CM | POA: Diagnosis not present

## 2018-09-14 DIAGNOSIS — T50902A Poisoning by unspecified drugs, medicaments and biological substances, intentional self-harm, initial encounter: Secondary | ICD-10-CM | POA: Diagnosis not present

## 2018-09-14 DIAGNOSIS — Z716 Tobacco abuse counseling: Secondary | ICD-10-CM | POA: Diagnosis not present

## 2018-09-14 DIAGNOSIS — G92 Toxic encephalopathy: Secondary | ICD-10-CM | POA: Diagnosis present

## 2018-09-14 DIAGNOSIS — I10 Essential (primary) hypertension: Secondary | ICD-10-CM | POA: Diagnosis present

## 2018-09-14 DIAGNOSIS — T887XXA Unspecified adverse effect of drug or medicament, initial encounter: Secondary | ICD-10-CM | POA: Diagnosis not present

## 2018-09-14 DIAGNOSIS — R531 Weakness: Secondary | ICD-10-CM | POA: Diagnosis not present

## 2018-09-14 DIAGNOSIS — Z79811 Long term (current) use of aromatase inhibitors: Secondary | ICD-10-CM | POA: Diagnosis not present

## 2018-09-14 DIAGNOSIS — E876 Hypokalemia: Secondary | ICD-10-CM | POA: Diagnosis present

## 2018-09-14 DIAGNOSIS — Z0001 Encounter for general adult medical examination with abnormal findings: Secondary | ICD-10-CM | POA: Diagnosis not present

## 2018-09-14 DIAGNOSIS — S2232XA Fracture of one rib, left side, initial encounter for closed fracture: Secondary | ICD-10-CM | POA: Diagnosis not present

## 2018-09-14 DIAGNOSIS — Z853 Personal history of malignant neoplasm of breast: Secondary | ICD-10-CM | POA: Diagnosis not present

## 2018-09-14 DIAGNOSIS — R748 Abnormal levels of other serum enzymes: Secondary | ICD-10-CM | POA: Diagnosis present

## 2018-09-14 DIAGNOSIS — C7951 Secondary malignant neoplasm of bone: Secondary | ICD-10-CM | POA: Diagnosis present

## 2018-09-14 DIAGNOSIS — T1491XA Suicide attempt, initial encounter: Secondary | ICD-10-CM | POA: Diagnosis not present

## 2018-09-14 DIAGNOSIS — F418 Other specified anxiety disorders: Secondary | ICD-10-CM | POA: Diagnosis present

## 2018-09-14 DIAGNOSIS — C7800 Secondary malignant neoplasm of unspecified lung: Secondary | ICD-10-CM | POA: Diagnosis present

## 2018-09-14 DIAGNOSIS — Z923 Personal history of irradiation: Secondary | ICD-10-CM | POA: Diagnosis not present

## 2018-09-14 DIAGNOSIS — Z720 Tobacco use: Secondary | ICD-10-CM | POA: Diagnosis not present

## 2018-09-14 DIAGNOSIS — Z515 Encounter for palliative care: Secondary | ICD-10-CM | POA: Diagnosis not present

## 2018-09-14 DIAGNOSIS — T50904A Poisoning by unspecified drugs, medicaments and biological substances, undetermined, initial encounter: Secondary | ICD-10-CM | POA: Diagnosis not present

## 2018-09-14 DIAGNOSIS — F17210 Nicotine dependence, cigarettes, uncomplicated: Secondary | ICD-10-CM | POA: Diagnosis present

## 2018-09-15 DIAGNOSIS — Z923 Personal history of irradiation: Secondary | ICD-10-CM

## 2018-09-15 DIAGNOSIS — R1011 Right upper quadrant pain: Secondary | ICD-10-CM

## 2018-09-15 DIAGNOSIS — C782 Secondary malignant neoplasm of pleura: Secondary | ICD-10-CM

## 2018-09-15 DIAGNOSIS — Z9221 Personal history of antineoplastic chemotherapy: Secondary | ICD-10-CM

## 2018-09-15 DIAGNOSIS — E876 Hypokalemia: Secondary | ICD-10-CM

## 2018-09-15 DIAGNOSIS — Z720 Tobacco use: Secondary | ICD-10-CM

## 2018-09-15 DIAGNOSIS — R945 Abnormal results of liver function studies: Secondary | ICD-10-CM

## 2018-09-15 DIAGNOSIS — T1491XA Suicide attempt, initial encounter: Secondary | ICD-10-CM | POA: Diagnosis not present

## 2018-09-15 DIAGNOSIS — Z79899 Other long term (current) drug therapy: Secondary | ICD-10-CM

## 2018-09-15 DIAGNOSIS — F410 Panic disorder [episodic paroxysmal anxiety] without agoraphobia: Secondary | ICD-10-CM

## 2018-09-15 DIAGNOSIS — Z79811 Long term (current) use of aromatase inhibitors: Secondary | ICD-10-CM

## 2018-09-15 DIAGNOSIS — T50902A Poisoning by unspecified drugs, medicaments and biological substances, intentional self-harm, initial encounter: Secondary | ICD-10-CM | POA: Diagnosis not present

## 2018-09-15 DIAGNOSIS — C50911 Malignant neoplasm of unspecified site of right female breast: Secondary | ICD-10-CM | POA: Diagnosis not present

## 2018-09-15 DIAGNOSIS — C7951 Secondary malignant neoplasm of bone: Secondary | ICD-10-CM | POA: Diagnosis not present

## 2018-09-15 DIAGNOSIS — T424X2A Poisoning by benzodiazepines, intentional self-harm, initial encounter: Secondary | ICD-10-CM

## 2018-09-16 DIAGNOSIS — T50902A Poisoning by unspecified drugs, medicaments and biological substances, intentional self-harm, initial encounter: Secondary | ICD-10-CM | POA: Diagnosis not present

## 2018-09-16 DIAGNOSIS — T1491XA Suicide attempt, initial encounter: Secondary | ICD-10-CM | POA: Diagnosis not present

## 2018-09-17 DIAGNOSIS — Z720 Tobacco use: Secondary | ICD-10-CM

## 2018-09-17 DIAGNOSIS — Z79899 Other long term (current) drug therapy: Secondary | ICD-10-CM

## 2018-09-17 DIAGNOSIS — C782 Secondary malignant neoplasm of pleura: Secondary | ICD-10-CM

## 2018-09-17 DIAGNOSIS — C7800 Secondary malignant neoplasm of unspecified lung: Secondary | ICD-10-CM

## 2018-09-17 DIAGNOSIS — Z923 Personal history of irradiation: Secondary | ICD-10-CM

## 2018-09-17 DIAGNOSIS — Z9221 Personal history of antineoplastic chemotherapy: Secondary | ICD-10-CM

## 2018-09-17 DIAGNOSIS — T424X2A Poisoning by benzodiazepines, intentional self-harm, initial encounter: Secondary | ICD-10-CM

## 2018-09-17 DIAGNOSIS — T1491XA Suicide attempt, initial encounter: Secondary | ICD-10-CM | POA: Diagnosis not present

## 2018-09-17 DIAGNOSIS — C7951 Secondary malignant neoplasm of bone: Secondary | ICD-10-CM | POA: Diagnosis not present

## 2018-09-17 DIAGNOSIS — F410 Panic disorder [episodic paroxysmal anxiety] without agoraphobia: Secondary | ICD-10-CM

## 2018-09-17 DIAGNOSIS — Z79811 Long term (current) use of aromatase inhibitors: Secondary | ICD-10-CM

## 2018-09-17 DIAGNOSIS — T50902A Poisoning by unspecified drugs, medicaments and biological substances, intentional self-harm, initial encounter: Secondary | ICD-10-CM | POA: Diagnosis not present

## 2018-09-17 DIAGNOSIS — C50911 Malignant neoplasm of unspecified site of right female breast: Secondary | ICD-10-CM

## 2018-10-08 DIAGNOSIS — E559 Vitamin D deficiency, unspecified: Secondary | ICD-10-CM | POA: Diagnosis not present

## 2018-10-08 DIAGNOSIS — I10 Essential (primary) hypertension: Secondary | ICD-10-CM | POA: Diagnosis not present

## 2018-10-08 DIAGNOSIS — E785 Hyperlipidemia, unspecified: Secondary | ICD-10-CM | POA: Diagnosis not present

## 2018-10-08 DIAGNOSIS — M791 Myalgia, unspecified site: Secondary | ICD-10-CM | POA: Diagnosis not present

## 2018-10-09 DIAGNOSIS — M8580 Other specified disorders of bone density and structure, unspecified site: Secondary | ICD-10-CM

## 2018-10-09 DIAGNOSIS — Z720 Tobacco use: Secondary | ICD-10-CM

## 2018-10-09 DIAGNOSIS — C778 Secondary and unspecified malignant neoplasm of lymph nodes of multiple regions: Secondary | ICD-10-CM | POA: Diagnosis not present

## 2018-10-09 DIAGNOSIS — C50911 Malignant neoplasm of unspecified site of right female breast: Secondary | ICD-10-CM | POA: Diagnosis not present

## 2018-10-09 DIAGNOSIS — F418 Other specified anxiety disorders: Secondary | ICD-10-CM

## 2018-10-09 DIAGNOSIS — Z9221 Personal history of antineoplastic chemotherapy: Secondary | ICD-10-CM | POA: Diagnosis not present

## 2018-10-09 DIAGNOSIS — C7951 Secondary malignant neoplasm of bone: Secondary | ICD-10-CM | POA: Diagnosis not present

## 2018-10-09 DIAGNOSIS — C7989 Secondary malignant neoplasm of other specified sites: Secondary | ICD-10-CM | POA: Diagnosis not present

## 2018-10-09 DIAGNOSIS — C50411 Malignant neoplasm of upper-outer quadrant of right female breast: Secondary | ICD-10-CM | POA: Diagnosis not present

## 2018-10-09 DIAGNOSIS — C782 Secondary malignant neoplasm of pleura: Secondary | ICD-10-CM | POA: Diagnosis not present

## 2018-10-09 DIAGNOSIS — Z923 Personal history of irradiation: Secondary | ICD-10-CM | POA: Diagnosis not present

## 2018-10-09 DIAGNOSIS — I10 Essential (primary) hypertension: Secondary | ICD-10-CM

## 2018-10-09 DIAGNOSIS — E538 Deficiency of other specified B group vitamins: Secondary | ICD-10-CM

## 2018-10-23 DIAGNOSIS — E559 Vitamin D deficiency, unspecified: Secondary | ICD-10-CM | POA: Diagnosis not present

## 2018-10-23 DIAGNOSIS — Z1331 Encounter for screening for depression: Secondary | ICD-10-CM | POA: Diagnosis not present

## 2018-10-23 DIAGNOSIS — M791 Myalgia, unspecified site: Secondary | ICD-10-CM | POA: Diagnosis not present

## 2018-10-23 DIAGNOSIS — E785 Hyperlipidemia, unspecified: Secondary | ICD-10-CM | POA: Diagnosis not present

## 2018-10-23 DIAGNOSIS — F3341 Major depressive disorder, recurrent, in partial remission: Secondary | ICD-10-CM | POA: Diagnosis not present

## 2018-10-23 DIAGNOSIS — E118 Type 2 diabetes mellitus with unspecified complications: Secondary | ICD-10-CM | POA: Diagnosis not present

## 2018-10-23 DIAGNOSIS — I10 Essential (primary) hypertension: Secondary | ICD-10-CM | POA: Diagnosis not present

## 2018-11-06 DIAGNOSIS — Z716 Tobacco abuse counseling: Secondary | ICD-10-CM | POA: Diagnosis not present

## 2018-11-06 DIAGNOSIS — M8580 Other specified disorders of bone density and structure, unspecified site: Secondary | ICD-10-CM | POA: Diagnosis not present

## 2018-11-06 DIAGNOSIS — C778 Secondary and unspecified malignant neoplasm of lymph nodes of multiple regions: Secondary | ICD-10-CM | POA: Diagnosis not present

## 2018-11-06 DIAGNOSIS — Z923 Personal history of irradiation: Secondary | ICD-10-CM | POA: Diagnosis not present

## 2018-11-06 DIAGNOSIS — Z853 Personal history of malignant neoplasm of breast: Secondary | ICD-10-CM | POA: Diagnosis not present

## 2018-11-06 DIAGNOSIS — C50411 Malignant neoplasm of upper-outer quadrant of right female breast: Secondary | ICD-10-CM | POA: Diagnosis not present

## 2018-11-06 DIAGNOSIS — M8589 Other specified disorders of bone density and structure, multiple sites: Secondary | ICD-10-CM | POA: Diagnosis not present

## 2018-11-06 DIAGNOSIS — C782 Secondary malignant neoplasm of pleura: Secondary | ICD-10-CM | POA: Diagnosis not present

## 2018-11-06 DIAGNOSIS — E538 Deficiency of other specified B group vitamins: Secondary | ICD-10-CM

## 2018-11-06 DIAGNOSIS — Z9221 Personal history of antineoplastic chemotherapy: Secondary | ICD-10-CM | POA: Diagnosis not present

## 2018-11-06 DIAGNOSIS — R945 Abnormal results of liver function studies: Secondary | ICD-10-CM

## 2018-11-06 DIAGNOSIS — C7989 Secondary malignant neoplasm of other specified sites: Secondary | ICD-10-CM | POA: Diagnosis not present

## 2018-11-06 DIAGNOSIS — C7951 Secondary malignant neoplasm of bone: Secondary | ICD-10-CM | POA: Diagnosis not present

## 2018-11-06 DIAGNOSIS — F418 Other specified anxiety disorders: Secondary | ICD-10-CM | POA: Diagnosis not present

## 2018-11-13 DIAGNOSIS — M791 Myalgia, unspecified site: Secondary | ICD-10-CM | POA: Diagnosis not present

## 2018-11-13 DIAGNOSIS — E559 Vitamin D deficiency, unspecified: Secondary | ICD-10-CM | POA: Diagnosis not present

## 2018-11-13 DIAGNOSIS — K219 Gastro-esophageal reflux disease without esophagitis: Secondary | ICD-10-CM | POA: Diagnosis not present

## 2018-11-13 DIAGNOSIS — F3341 Major depressive disorder, recurrent, in partial remission: Secondary | ICD-10-CM | POA: Diagnosis not present

## 2018-12-02 DIAGNOSIS — C50911 Malignant neoplasm of unspecified site of right female breast: Secondary | ICD-10-CM | POA: Diagnosis not present

## 2018-12-02 DIAGNOSIS — C7951 Secondary malignant neoplasm of bone: Secondary | ICD-10-CM | POA: Diagnosis not present

## 2018-12-02 DIAGNOSIS — C7800 Secondary malignant neoplasm of unspecified lung: Secondary | ICD-10-CM | POA: Diagnosis not present

## 2018-12-02 DIAGNOSIS — C787 Secondary malignant neoplasm of liver and intrahepatic bile duct: Secondary | ICD-10-CM | POA: Diagnosis not present

## 2018-12-02 DIAGNOSIS — C50411 Malignant neoplasm of upper-outer quadrant of right female breast: Secondary | ICD-10-CM | POA: Diagnosis not present

## 2018-12-04 DIAGNOSIS — C384 Malignant neoplasm of pleura: Secondary | ICD-10-CM | POA: Diagnosis not present

## 2018-12-04 DIAGNOSIS — Z853 Personal history of malignant neoplasm of breast: Secondary | ICD-10-CM | POA: Diagnosis not present

## 2018-12-04 DIAGNOSIS — M8589 Other specified disorders of bone density and structure, multiple sites: Secondary | ICD-10-CM | POA: Diagnosis not present

## 2018-12-04 DIAGNOSIS — F17210 Nicotine dependence, cigarettes, uncomplicated: Secondary | ICD-10-CM | POA: Diagnosis not present

## 2018-12-04 DIAGNOSIS — C7951 Secondary malignant neoplasm of bone: Secondary | ICD-10-CM | POA: Diagnosis not present

## 2018-12-04 DIAGNOSIS — C50919 Malignant neoplasm of unspecified site of unspecified female breast: Secondary | ICD-10-CM | POA: Diagnosis not present

## 2018-12-04 DIAGNOSIS — E538 Deficiency of other specified B group vitamins: Secondary | ICD-10-CM | POA: Diagnosis not present

## 2018-12-04 DIAGNOSIS — R748 Abnormal levels of other serum enzymes: Secondary | ICD-10-CM | POA: Diagnosis not present

## 2018-12-16 DIAGNOSIS — K219 Gastro-esophageal reflux disease without esophagitis: Secondary | ICD-10-CM | POA: Diagnosis not present

## 2018-12-16 DIAGNOSIS — E559 Vitamin D deficiency, unspecified: Secondary | ICD-10-CM | POA: Diagnosis not present

## 2018-12-16 DIAGNOSIS — F419 Anxiety disorder, unspecified: Secondary | ICD-10-CM | POA: Diagnosis not present

## 2018-12-16 DIAGNOSIS — M791 Myalgia, unspecified site: Secondary | ICD-10-CM | POA: Diagnosis not present

## 2019-01-01 DIAGNOSIS — E538 Deficiency of other specified B group vitamins: Secondary | ICD-10-CM | POA: Diagnosis not present

## 2019-01-01 DIAGNOSIS — R978 Other abnormal tumor markers: Secondary | ICD-10-CM | POA: Diagnosis not present

## 2019-01-01 DIAGNOSIS — C7989 Secondary malignant neoplasm of other specified sites: Secondary | ICD-10-CM | POA: Diagnosis not present

## 2019-01-01 DIAGNOSIS — C778 Secondary and unspecified malignant neoplasm of lymph nodes of multiple regions: Secondary | ICD-10-CM | POA: Diagnosis not present

## 2019-01-01 DIAGNOSIS — C50411 Malignant neoplasm of upper-outer quadrant of right female breast: Secondary | ICD-10-CM | POA: Diagnosis not present

## 2019-01-04 DIAGNOSIS — I10 Essential (primary) hypertension: Secondary | ICD-10-CM | POA: Diagnosis not present

## 2019-01-04 DIAGNOSIS — M542 Cervicalgia: Secondary | ICD-10-CM | POA: Diagnosis not present

## 2019-01-04 DIAGNOSIS — R52 Pain, unspecified: Secondary | ICD-10-CM | POA: Diagnosis not present

## 2019-01-04 DIAGNOSIS — Z79899 Other long term (current) drug therapy: Secondary | ICD-10-CM | POA: Diagnosis not present

## 2019-01-04 DIAGNOSIS — F17210 Nicotine dependence, cigarettes, uncomplicated: Secondary | ICD-10-CM | POA: Diagnosis not present

## 2019-01-05 DIAGNOSIS — C7952 Secondary malignant neoplasm of bone marrow: Secondary | ICD-10-CM | POA: Diagnosis not present

## 2019-01-05 DIAGNOSIS — C50411 Malignant neoplasm of upper-outer quadrant of right female breast: Secondary | ICD-10-CM | POA: Diagnosis not present

## 2019-01-07 DIAGNOSIS — C7951 Secondary malignant neoplasm of bone: Secondary | ICD-10-CM | POA: Diagnosis not present

## 2019-01-07 DIAGNOSIS — Z853 Personal history of malignant neoplasm of breast: Secondary | ICD-10-CM | POA: Diagnosis not present

## 2019-01-07 DIAGNOSIS — C384 Malignant neoplasm of pleura: Secondary | ICD-10-CM | POA: Diagnosis not present

## 2019-01-12 DIAGNOSIS — Z510 Encounter for antineoplastic radiation therapy: Secondary | ICD-10-CM | POA: Diagnosis not present

## 2019-01-12 DIAGNOSIS — C384 Malignant neoplasm of pleura: Secondary | ICD-10-CM | POA: Diagnosis not present

## 2019-01-13 DIAGNOSIS — E559 Vitamin D deficiency, unspecified: Secondary | ICD-10-CM | POA: Diagnosis not present

## 2019-01-13 DIAGNOSIS — F419 Anxiety disorder, unspecified: Secondary | ICD-10-CM | POA: Diagnosis not present

## 2019-01-13 DIAGNOSIS — C7951 Secondary malignant neoplasm of bone: Secondary | ICD-10-CM | POA: Diagnosis not present

## 2019-01-13 DIAGNOSIS — M791 Myalgia, unspecified site: Secondary | ICD-10-CM | POA: Diagnosis not present

## 2019-01-13 DIAGNOSIS — K219 Gastro-esophageal reflux disease without esophagitis: Secondary | ICD-10-CM | POA: Diagnosis not present

## 2019-01-14 DIAGNOSIS — Z510 Encounter for antineoplastic radiation therapy: Secondary | ICD-10-CM | POA: Diagnosis not present

## 2019-01-14 DIAGNOSIS — J910 Malignant pleural effusion: Secondary | ICD-10-CM | POA: Diagnosis not present

## 2019-01-14 DIAGNOSIS — C778 Secondary and unspecified malignant neoplasm of lymph nodes of multiple regions: Secondary | ICD-10-CM | POA: Diagnosis not present

## 2019-01-14 DIAGNOSIS — C7951 Secondary malignant neoplasm of bone: Secondary | ICD-10-CM | POA: Diagnosis not present

## 2019-01-14 DIAGNOSIS — C7989 Secondary malignant neoplasm of other specified sites: Secondary | ICD-10-CM | POA: Diagnosis not present

## 2019-01-14 DIAGNOSIS — C50411 Malignant neoplasm of upper-outer quadrant of right female breast: Secondary | ICD-10-CM | POA: Diagnosis not present

## 2019-01-14 DIAGNOSIS — E538 Deficiency of other specified B group vitamins: Secondary | ICD-10-CM | POA: Diagnosis not present

## 2019-01-14 DIAGNOSIS — C782 Secondary malignant neoplasm of pleura: Secondary | ICD-10-CM | POA: Diagnosis not present

## 2019-01-15 DIAGNOSIS — Z510 Encounter for antineoplastic radiation therapy: Secondary | ICD-10-CM | POA: Diagnosis not present

## 2019-01-15 DIAGNOSIS — C7951 Secondary malignant neoplasm of bone: Secondary | ICD-10-CM | POA: Diagnosis not present

## 2019-01-15 DIAGNOSIS — C7989 Secondary malignant neoplasm of other specified sites: Secondary | ICD-10-CM | POA: Diagnosis not present

## 2019-01-15 DIAGNOSIS — C50411 Malignant neoplasm of upper-outer quadrant of right female breast: Secondary | ICD-10-CM | POA: Diagnosis not present

## 2019-01-15 DIAGNOSIS — Z5111 Encounter for antineoplastic chemotherapy: Secondary | ICD-10-CM | POA: Diagnosis not present

## 2019-01-15 DIAGNOSIS — C778 Secondary and unspecified malignant neoplasm of lymph nodes of multiple regions: Secondary | ICD-10-CM | POA: Diagnosis not present

## 2019-01-15 DIAGNOSIS — C782 Secondary malignant neoplasm of pleura: Secondary | ICD-10-CM | POA: Diagnosis not present

## 2019-01-16 DIAGNOSIS — C50411 Malignant neoplasm of upper-outer quadrant of right female breast: Secondary | ICD-10-CM | POA: Diagnosis not present

## 2019-01-16 DIAGNOSIS — C7951 Secondary malignant neoplasm of bone: Secondary | ICD-10-CM | POA: Diagnosis not present

## 2019-01-16 DIAGNOSIS — Z510 Encounter for antineoplastic radiation therapy: Secondary | ICD-10-CM | POA: Diagnosis not present

## 2019-01-16 DIAGNOSIS — C778 Secondary and unspecified malignant neoplasm of lymph nodes of multiple regions: Secondary | ICD-10-CM | POA: Diagnosis not present

## 2019-01-16 DIAGNOSIS — C782 Secondary malignant neoplasm of pleura: Secondary | ICD-10-CM | POA: Diagnosis not present

## 2019-01-16 DIAGNOSIS — J910 Malignant pleural effusion: Secondary | ICD-10-CM | POA: Diagnosis not present

## 2019-01-16 DIAGNOSIS — C384 Malignant neoplasm of pleura: Secondary | ICD-10-CM | POA: Diagnosis not present

## 2019-01-16 DIAGNOSIS — C7989 Secondary malignant neoplasm of other specified sites: Secondary | ICD-10-CM | POA: Diagnosis not present

## 2019-01-19 DIAGNOSIS — J910 Malignant pleural effusion: Secondary | ICD-10-CM | POA: Diagnosis not present

## 2019-01-19 DIAGNOSIS — C384 Malignant neoplasm of pleura: Secondary | ICD-10-CM | POA: Diagnosis not present

## 2019-01-19 DIAGNOSIS — C778 Secondary and unspecified malignant neoplasm of lymph nodes of multiple regions: Secondary | ICD-10-CM | POA: Diagnosis not present

## 2019-01-19 DIAGNOSIS — C7951 Secondary malignant neoplasm of bone: Secondary | ICD-10-CM | POA: Diagnosis not present

## 2019-01-19 DIAGNOSIS — Z510 Encounter for antineoplastic radiation therapy: Secondary | ICD-10-CM | POA: Diagnosis not present

## 2019-01-19 DIAGNOSIS — C7989 Secondary malignant neoplasm of other specified sites: Secondary | ICD-10-CM | POA: Diagnosis not present

## 2019-01-19 DIAGNOSIS — C50411 Malignant neoplasm of upper-outer quadrant of right female breast: Secondary | ICD-10-CM | POA: Diagnosis not present

## 2019-01-20 DIAGNOSIS — C50411 Malignant neoplasm of upper-outer quadrant of right female breast: Secondary | ICD-10-CM | POA: Diagnosis not present

## 2019-01-20 DIAGNOSIS — C778 Secondary and unspecified malignant neoplasm of lymph nodes of multiple regions: Secondary | ICD-10-CM | POA: Diagnosis not present

## 2019-01-20 DIAGNOSIS — J910 Malignant pleural effusion: Secondary | ICD-10-CM | POA: Diagnosis not present

## 2019-01-20 DIAGNOSIS — C7989 Secondary malignant neoplasm of other specified sites: Secondary | ICD-10-CM | POA: Diagnosis not present

## 2019-01-20 DIAGNOSIS — C7951 Secondary malignant neoplasm of bone: Secondary | ICD-10-CM | POA: Diagnosis not present

## 2019-01-20 DIAGNOSIS — Z510 Encounter for antineoplastic radiation therapy: Secondary | ICD-10-CM | POA: Diagnosis not present

## 2019-01-20 DIAGNOSIS — C384 Malignant neoplasm of pleura: Secondary | ICD-10-CM | POA: Diagnosis not present

## 2019-01-21 DIAGNOSIS — C7989 Secondary malignant neoplasm of other specified sites: Secondary | ICD-10-CM | POA: Diagnosis not present

## 2019-01-21 DIAGNOSIS — C384 Malignant neoplasm of pleura: Secondary | ICD-10-CM | POA: Diagnosis not present

## 2019-01-21 DIAGNOSIS — Z510 Encounter for antineoplastic radiation therapy: Secondary | ICD-10-CM | POA: Diagnosis not present

## 2019-01-21 DIAGNOSIS — C50411 Malignant neoplasm of upper-outer quadrant of right female breast: Secondary | ICD-10-CM | POA: Diagnosis not present

## 2019-01-21 DIAGNOSIS — C7951 Secondary malignant neoplasm of bone: Secondary | ICD-10-CM | POA: Diagnosis not present

## 2019-01-21 DIAGNOSIS — J910 Malignant pleural effusion: Secondary | ICD-10-CM | POA: Diagnosis not present

## 2019-01-21 DIAGNOSIS — C778 Secondary and unspecified malignant neoplasm of lymph nodes of multiple regions: Secondary | ICD-10-CM | POA: Diagnosis not present

## 2019-01-22 DIAGNOSIS — C7951 Secondary malignant neoplasm of bone: Secondary | ICD-10-CM | POA: Diagnosis not present

## 2019-01-22 DIAGNOSIS — Z510 Encounter for antineoplastic radiation therapy: Secondary | ICD-10-CM | POA: Diagnosis not present

## 2019-01-22 DIAGNOSIS — C50411 Malignant neoplasm of upper-outer quadrant of right female breast: Secondary | ICD-10-CM | POA: Diagnosis not present

## 2019-01-22 DIAGNOSIS — C384 Malignant neoplasm of pleura: Secondary | ICD-10-CM | POA: Diagnosis not present

## 2019-01-22 DIAGNOSIS — J910 Malignant pleural effusion: Secondary | ICD-10-CM | POA: Diagnosis not present

## 2019-01-22 DIAGNOSIS — C778 Secondary and unspecified malignant neoplasm of lymph nodes of multiple regions: Secondary | ICD-10-CM | POA: Diagnosis not present

## 2019-01-22 DIAGNOSIS — C7989 Secondary malignant neoplasm of other specified sites: Secondary | ICD-10-CM | POA: Diagnosis not present

## 2019-01-23 DIAGNOSIS — C7951 Secondary malignant neoplasm of bone: Secondary | ICD-10-CM | POA: Diagnosis not present

## 2019-01-23 DIAGNOSIS — Z510 Encounter for antineoplastic radiation therapy: Secondary | ICD-10-CM | POA: Diagnosis not present

## 2019-01-23 DIAGNOSIS — C384 Malignant neoplasm of pleura: Secondary | ICD-10-CM | POA: Diagnosis not present

## 2019-01-23 DIAGNOSIS — C50411 Malignant neoplasm of upper-outer quadrant of right female breast: Secondary | ICD-10-CM | POA: Diagnosis not present

## 2019-01-23 DIAGNOSIS — C7989 Secondary malignant neoplasm of other specified sites: Secondary | ICD-10-CM | POA: Diagnosis not present

## 2019-01-23 DIAGNOSIS — C778 Secondary and unspecified malignant neoplasm of lymph nodes of multiple regions: Secondary | ICD-10-CM | POA: Diagnosis not present

## 2019-01-23 DIAGNOSIS — J910 Malignant pleural effusion: Secondary | ICD-10-CM | POA: Diagnosis not present

## 2019-01-26 DIAGNOSIS — C7989 Secondary malignant neoplasm of other specified sites: Secondary | ICD-10-CM | POA: Diagnosis not present

## 2019-01-26 DIAGNOSIS — C778 Secondary and unspecified malignant neoplasm of lymph nodes of multiple regions: Secondary | ICD-10-CM | POA: Diagnosis not present

## 2019-01-26 DIAGNOSIS — C384 Malignant neoplasm of pleura: Secondary | ICD-10-CM | POA: Diagnosis not present

## 2019-01-26 DIAGNOSIS — C7951 Secondary malignant neoplasm of bone: Secondary | ICD-10-CM | POA: Diagnosis not present

## 2019-01-26 DIAGNOSIS — Z510 Encounter for antineoplastic radiation therapy: Secondary | ICD-10-CM | POA: Diagnosis not present

## 2019-01-26 DIAGNOSIS — C50411 Malignant neoplasm of upper-outer quadrant of right female breast: Secondary | ICD-10-CM | POA: Diagnosis not present

## 2019-01-26 DIAGNOSIS — J910 Malignant pleural effusion: Secondary | ICD-10-CM | POA: Diagnosis not present

## 2019-01-27 DIAGNOSIS — C384 Malignant neoplasm of pleura: Secondary | ICD-10-CM | POA: Diagnosis not present

## 2019-01-27 DIAGNOSIS — J910 Malignant pleural effusion: Secondary | ICD-10-CM | POA: Diagnosis not present

## 2019-01-27 DIAGNOSIS — C7951 Secondary malignant neoplasm of bone: Secondary | ICD-10-CM | POA: Diagnosis not present

## 2019-01-27 DIAGNOSIS — C778 Secondary and unspecified malignant neoplasm of lymph nodes of multiple regions: Secondary | ICD-10-CM | POA: Diagnosis not present

## 2019-01-27 DIAGNOSIS — C7989 Secondary malignant neoplasm of other specified sites: Secondary | ICD-10-CM | POA: Diagnosis not present

## 2019-01-27 DIAGNOSIS — C50411 Malignant neoplasm of upper-outer quadrant of right female breast: Secondary | ICD-10-CM | POA: Diagnosis not present

## 2019-01-27 DIAGNOSIS — Z510 Encounter for antineoplastic radiation therapy: Secondary | ICD-10-CM | POA: Diagnosis not present

## 2019-02-02 DIAGNOSIS — C7951 Secondary malignant neoplasm of bone: Secondary | ICD-10-CM

## 2019-02-02 DIAGNOSIS — Z853 Personal history of malignant neoplasm of breast: Secondary | ICD-10-CM

## 2019-02-02 DIAGNOSIS — E538 Deficiency of other specified B group vitamins: Secondary | ICD-10-CM | POA: Diagnosis not present

## 2019-02-02 DIAGNOSIS — E876 Hypokalemia: Secondary | ICD-10-CM | POA: Diagnosis not present

## 2019-02-02 DIAGNOSIS — M8589 Other specified disorders of bone density and structure, multiple sites: Secondary | ICD-10-CM | POA: Diagnosis not present

## 2019-02-02 DIAGNOSIS — F17210 Nicotine dependence, cigarettes, uncomplicated: Secondary | ICD-10-CM | POA: Diagnosis not present

## 2019-02-02 DIAGNOSIS — R978 Other abnormal tumor markers: Secondary | ICD-10-CM | POA: Diagnosis not present

## 2019-02-02 DIAGNOSIS — C50411 Malignant neoplasm of upper-outer quadrant of right female breast: Secondary | ICD-10-CM | POA: Diagnosis not present

## 2019-02-16 DIAGNOSIS — K219 Gastro-esophageal reflux disease without esophagitis: Secondary | ICD-10-CM | POA: Diagnosis not present

## 2019-02-16 DIAGNOSIS — E559 Vitamin D deficiency, unspecified: Secondary | ICD-10-CM | POA: Diagnosis not present

## 2019-02-16 DIAGNOSIS — M791 Myalgia, unspecified site: Secondary | ICD-10-CM | POA: Diagnosis not present

## 2019-02-16 DIAGNOSIS — F419 Anxiety disorder, unspecified: Secondary | ICD-10-CM | POA: Diagnosis not present

## 2019-02-27 DIAGNOSIS — N289 Disorder of kidney and ureter, unspecified: Secondary | ICD-10-CM | POA: Diagnosis not present

## 2019-02-27 DIAGNOSIS — C50912 Malignant neoplasm of unspecified site of left female breast: Secondary | ICD-10-CM | POA: Diagnosis not present

## 2019-02-27 DIAGNOSIS — C7931 Secondary malignant neoplasm of brain: Secondary | ICD-10-CM | POA: Diagnosis not present

## 2019-02-27 DIAGNOSIS — C801 Malignant (primary) neoplasm, unspecified: Secondary | ICD-10-CM | POA: Diagnosis not present

## 2019-02-27 DIAGNOSIS — I700 Atherosclerosis of aorta: Secondary | ICD-10-CM | POA: Diagnosis not present

## 2019-02-27 DIAGNOSIS — I2510 Atherosclerotic heart disease of native coronary artery without angina pectoris: Secondary | ICD-10-CM | POA: Diagnosis not present

## 2019-02-27 DIAGNOSIS — Z95828 Presence of other vascular implants and grafts: Secondary | ICD-10-CM | POA: Diagnosis not present

## 2019-02-27 DIAGNOSIS — R1013 Epigastric pain: Secondary | ICD-10-CM | POA: Diagnosis not present

## 2019-02-27 DIAGNOSIS — R911 Solitary pulmonary nodule: Secondary | ICD-10-CM | POA: Diagnosis not present

## 2019-02-27 DIAGNOSIS — C7800 Secondary malignant neoplasm of unspecified lung: Secondary | ICD-10-CM | POA: Diagnosis not present

## 2019-02-27 DIAGNOSIS — C787 Secondary malignant neoplasm of liver and intrahepatic bile duct: Secondary | ICD-10-CM | POA: Diagnosis not present

## 2019-02-27 DIAGNOSIS — C50919 Malignant neoplasm of unspecified site of unspecified female breast: Secondary | ICD-10-CM | POA: Diagnosis not present

## 2019-02-27 DIAGNOSIS — M899 Disorder of bone, unspecified: Secondary | ICD-10-CM | POA: Diagnosis not present

## 2019-02-27 DIAGNOSIS — R509 Fever, unspecified: Secondary | ICD-10-CM | POA: Diagnosis not present

## 2019-02-27 DIAGNOSIS — C7951 Secondary malignant neoplasm of bone: Secondary | ICD-10-CM | POA: Diagnosis not present

## 2019-03-02 DIAGNOSIS — R978 Other abnormal tumor markers: Secondary | ICD-10-CM | POA: Diagnosis not present

## 2019-03-02 DIAGNOSIS — E538 Deficiency of other specified B group vitamins: Secondary | ICD-10-CM | POA: Diagnosis not present

## 2019-03-02 DIAGNOSIS — C778 Secondary and unspecified malignant neoplasm of lymph nodes of multiple regions: Secondary | ICD-10-CM | POA: Diagnosis not present

## 2019-03-02 DIAGNOSIS — C7989 Secondary malignant neoplasm of other specified sites: Secondary | ICD-10-CM | POA: Diagnosis not present

## 2019-03-02 DIAGNOSIS — C782 Secondary malignant neoplasm of pleura: Secondary | ICD-10-CM | POA: Diagnosis not present

## 2019-03-16 DIAGNOSIS — C7989 Secondary malignant neoplasm of other specified sites: Secondary | ICD-10-CM | POA: Diagnosis not present

## 2019-03-16 DIAGNOSIS — C778 Secondary and unspecified malignant neoplasm of lymph nodes of multiple regions: Secondary | ICD-10-CM | POA: Diagnosis not present

## 2019-03-16 DIAGNOSIS — C50411 Malignant neoplasm of upper-outer quadrant of right female breast: Secondary | ICD-10-CM | POA: Diagnosis not present

## 2019-03-17 DIAGNOSIS — M791 Myalgia, unspecified site: Secondary | ICD-10-CM | POA: Diagnosis not present

## 2019-03-17 DIAGNOSIS — F419 Anxiety disorder, unspecified: Secondary | ICD-10-CM | POA: Diagnosis not present

## 2019-03-17 DIAGNOSIS — E559 Vitamin D deficiency, unspecified: Secondary | ICD-10-CM | POA: Diagnosis not present

## 2019-03-17 DIAGNOSIS — K219 Gastro-esophageal reflux disease without esophagitis: Secondary | ICD-10-CM | POA: Diagnosis not present

## 2019-03-30 DIAGNOSIS — F17210 Nicotine dependence, cigarettes, uncomplicated: Secondary | ICD-10-CM | POA: Diagnosis not present

## 2019-03-30 DIAGNOSIS — C778 Secondary and unspecified malignant neoplasm of lymph nodes of multiple regions: Secondary | ICD-10-CM | POA: Diagnosis not present

## 2019-03-30 DIAGNOSIS — C7951 Secondary malignant neoplasm of bone: Secondary | ICD-10-CM | POA: Diagnosis not present

## 2019-03-30 DIAGNOSIS — R978 Other abnormal tumor markers: Secondary | ICD-10-CM | POA: Diagnosis not present

## 2019-03-30 DIAGNOSIS — C50411 Malignant neoplasm of upper-outer quadrant of right female breast: Secondary | ICD-10-CM | POA: Diagnosis not present

## 2019-03-30 DIAGNOSIS — C782 Secondary malignant neoplasm of pleura: Secondary | ICD-10-CM | POA: Diagnosis not present

## 2019-03-30 DIAGNOSIS — Z853 Personal history of malignant neoplasm of breast: Secondary | ICD-10-CM | POA: Diagnosis not present

## 2019-03-30 DIAGNOSIS — F418 Other specified anxiety disorders: Secondary | ICD-10-CM | POA: Diagnosis not present

## 2019-03-30 DIAGNOSIS — C7989 Secondary malignant neoplasm of other specified sites: Secondary | ICD-10-CM | POA: Diagnosis not present

## 2019-04-14 DIAGNOSIS — K219 Gastro-esophageal reflux disease without esophagitis: Secondary | ICD-10-CM | POA: Diagnosis not present

## 2019-04-14 DIAGNOSIS — F419 Anxiety disorder, unspecified: Secondary | ICD-10-CM | POA: Diagnosis not present

## 2019-04-14 DIAGNOSIS — E559 Vitamin D deficiency, unspecified: Secondary | ICD-10-CM | POA: Diagnosis not present

## 2019-04-14 DIAGNOSIS — M791 Myalgia, unspecified site: Secondary | ICD-10-CM | POA: Diagnosis not present

## 2019-04-27 DIAGNOSIS — C782 Secondary malignant neoplasm of pleura: Secondary | ICD-10-CM | POA: Diagnosis not present

## 2019-04-27 DIAGNOSIS — C384 Malignant neoplasm of pleura: Secondary | ICD-10-CM | POA: Diagnosis not present

## 2019-04-27 DIAGNOSIS — C778 Secondary and unspecified malignant neoplasm of lymph nodes of multiple regions: Secondary | ICD-10-CM | POA: Diagnosis not present

## 2019-04-27 DIAGNOSIS — J910 Malignant pleural effusion: Secondary | ICD-10-CM | POA: Diagnosis not present

## 2019-04-27 DIAGNOSIS — E538 Deficiency of other specified B group vitamins: Secondary | ICD-10-CM | POA: Diagnosis not present

## 2019-04-27 DIAGNOSIS — C50411 Malignant neoplasm of upper-outer quadrant of right female breast: Secondary | ICD-10-CM | POA: Diagnosis not present

## 2019-04-27 DIAGNOSIS — Z5189 Encounter for other specified aftercare: Secondary | ICD-10-CM | POA: Diagnosis not present

## 2019-04-27 DIAGNOSIS — C7989 Secondary malignant neoplasm of other specified sites: Secondary | ICD-10-CM | POA: Diagnosis not present

## 2019-04-27 DIAGNOSIS — C50419 Malignant neoplasm of upper-outer quadrant of unspecified female breast: Secondary | ICD-10-CM | POA: Diagnosis not present

## 2019-05-12 DIAGNOSIS — K219 Gastro-esophageal reflux disease without esophagitis: Secondary | ICD-10-CM | POA: Diagnosis not present

## 2019-05-12 DIAGNOSIS — M791 Myalgia, unspecified site: Secondary | ICD-10-CM | POA: Diagnosis not present

## 2019-05-12 DIAGNOSIS — E559 Vitamin D deficiency, unspecified: Secondary | ICD-10-CM | POA: Diagnosis not present

## 2019-05-12 DIAGNOSIS — F419 Anxiety disorder, unspecified: Secondary | ICD-10-CM | POA: Diagnosis not present

## 2019-05-22 DIAGNOSIS — Z853 Personal history of malignant neoplasm of breast: Secondary | ICD-10-CM | POA: Diagnosis not present

## 2019-05-22 DIAGNOSIS — C50411 Malignant neoplasm of upper-outer quadrant of right female breast: Secondary | ICD-10-CM | POA: Diagnosis not present

## 2019-05-22 DIAGNOSIS — C787 Secondary malignant neoplasm of liver and intrahepatic bile duct: Secondary | ICD-10-CM | POA: Diagnosis not present

## 2019-05-25 DIAGNOSIS — C778 Secondary and unspecified malignant neoplasm of lymph nodes of multiple regions: Secondary | ICD-10-CM | POA: Diagnosis not present

## 2019-05-25 DIAGNOSIS — F329 Major depressive disorder, single episode, unspecified: Secondary | ICD-10-CM | POA: Diagnosis not present

## 2019-05-25 DIAGNOSIS — M542 Cervicalgia: Secondary | ICD-10-CM | POA: Diagnosis not present

## 2019-05-25 DIAGNOSIS — C50411 Malignant neoplasm of upper-outer quadrant of right female breast: Secondary | ICD-10-CM | POA: Diagnosis not present

## 2019-05-25 DIAGNOSIS — C782 Secondary malignant neoplasm of pleura: Secondary | ICD-10-CM | POA: Diagnosis not present

## 2019-05-25 DIAGNOSIS — E538 Deficiency of other specified B group vitamins: Secondary | ICD-10-CM | POA: Diagnosis not present

## 2019-05-25 DIAGNOSIS — C7951 Secondary malignant neoplasm of bone: Secondary | ICD-10-CM | POA: Diagnosis not present

## 2019-05-25 DIAGNOSIS — F418 Other specified anxiety disorders: Secondary | ICD-10-CM | POA: Diagnosis not present

## 2019-05-31 DIAGNOSIS — R197 Diarrhea, unspecified: Secondary | ICD-10-CM | POA: Diagnosis not present

## 2019-05-31 DIAGNOSIS — R112 Nausea with vomiting, unspecified: Secondary | ICD-10-CM | POA: Diagnosis not present

## 2019-05-31 DIAGNOSIS — B9689 Other specified bacterial agents as the cause of diseases classified elsewhere: Secondary | ICD-10-CM | POA: Diagnosis not present

## 2019-05-31 DIAGNOSIS — R000 Tachycardia, unspecified: Secondary | ICD-10-CM | POA: Diagnosis not present

## 2019-05-31 DIAGNOSIS — N390 Urinary tract infection, site not specified: Secondary | ICD-10-CM | POA: Diagnosis not present

## 2019-05-31 DIAGNOSIS — R1013 Epigastric pain: Secondary | ICD-10-CM | POA: Diagnosis not present

## 2019-05-31 DIAGNOSIS — C50919 Malignant neoplasm of unspecified site of unspecified female breast: Secondary | ICD-10-CM | POA: Diagnosis not present

## 2019-05-31 DIAGNOSIS — R1110 Vomiting, unspecified: Secondary | ICD-10-CM | POA: Diagnosis not present

## 2019-06-01 DIAGNOSIS — R112 Nausea with vomiting, unspecified: Secondary | ICD-10-CM | POA: Diagnosis not present

## 2019-06-01 DIAGNOSIS — R1110 Vomiting, unspecified: Secondary | ICD-10-CM | POA: Diagnosis not present

## 2019-06-01 DIAGNOSIS — C50919 Malignant neoplasm of unspecified site of unspecified female breast: Secondary | ICD-10-CM | POA: Diagnosis not present

## 2019-06-09 DIAGNOSIS — K219 Gastro-esophageal reflux disease without esophagitis: Secondary | ICD-10-CM | POA: Diagnosis not present

## 2019-06-09 DIAGNOSIS — E559 Vitamin D deficiency, unspecified: Secondary | ICD-10-CM | POA: Diagnosis not present

## 2019-06-09 DIAGNOSIS — F419 Anxiety disorder, unspecified: Secondary | ICD-10-CM | POA: Diagnosis not present

## 2019-06-09 DIAGNOSIS — M791 Myalgia, unspecified site: Secondary | ICD-10-CM | POA: Diagnosis not present

## 2019-06-24 DIAGNOSIS — E538 Deficiency of other specified B group vitamins: Secondary | ICD-10-CM | POA: Diagnosis not present

## 2019-06-24 DIAGNOSIS — C50411 Malignant neoplasm of upper-outer quadrant of right female breast: Secondary | ICD-10-CM | POA: Diagnosis not present

## 2019-06-24 DIAGNOSIS — C7951 Secondary malignant neoplasm of bone: Secondary | ICD-10-CM | POA: Diagnosis not present

## 2019-06-24 DIAGNOSIS — C7989 Secondary malignant neoplasm of other specified sites: Secondary | ICD-10-CM | POA: Diagnosis not present

## 2019-06-24 DIAGNOSIS — C782 Secondary malignant neoplasm of pleura: Secondary | ICD-10-CM | POA: Diagnosis not present

## 2019-06-24 DIAGNOSIS — C778 Secondary and unspecified malignant neoplasm of lymph nodes of multiple regions: Secondary | ICD-10-CM | POA: Diagnosis not present

## 2019-07-07 DIAGNOSIS — K219 Gastro-esophageal reflux disease without esophagitis: Secondary | ICD-10-CM | POA: Diagnosis not present

## 2019-07-07 DIAGNOSIS — F419 Anxiety disorder, unspecified: Secondary | ICD-10-CM | POA: Diagnosis not present

## 2019-07-07 DIAGNOSIS — M791 Myalgia, unspecified site: Secondary | ICD-10-CM | POA: Diagnosis not present

## 2019-07-07 DIAGNOSIS — E559 Vitamin D deficiency, unspecified: Secondary | ICD-10-CM | POA: Diagnosis not present

## 2019-07-24 DIAGNOSIS — J910 Malignant pleural effusion: Secondary | ICD-10-CM | POA: Diagnosis not present

## 2019-07-24 DIAGNOSIS — Z1211 Encounter for screening for malignant neoplasm of colon: Secondary | ICD-10-CM | POA: Diagnosis not present

## 2019-07-24 DIAGNOSIS — C50419 Malignant neoplasm of upper-outer quadrant of unspecified female breast: Secondary | ICD-10-CM | POA: Diagnosis not present

## 2019-07-24 DIAGNOSIS — C782 Secondary malignant neoplasm of pleura: Secondary | ICD-10-CM | POA: Diagnosis not present

## 2019-07-24 DIAGNOSIS — E538 Deficiency of other specified B group vitamins: Secondary | ICD-10-CM | POA: Diagnosis not present

## 2019-07-24 DIAGNOSIS — C778 Secondary and unspecified malignant neoplasm of lymph nodes of multiple regions: Secondary | ICD-10-CM | POA: Diagnosis not present

## 2019-07-24 DIAGNOSIS — Z170 Estrogen receptor positive status [ER+]: Secondary | ICD-10-CM | POA: Diagnosis not present

## 2019-07-24 DIAGNOSIS — C50411 Malignant neoplasm of upper-outer quadrant of right female breast: Secondary | ICD-10-CM | POA: Diagnosis not present

## 2019-07-24 DIAGNOSIS — C7989 Secondary malignant neoplasm of other specified sites: Secondary | ICD-10-CM | POA: Diagnosis not present

## 2019-07-24 DIAGNOSIS — Z23 Encounter for immunization: Secondary | ICD-10-CM | POA: Diagnosis not present

## 2019-07-24 DIAGNOSIS — Z0000 Encounter for general adult medical examination without abnormal findings: Secondary | ICD-10-CM | POA: Diagnosis not present

## 2019-07-24 DIAGNOSIS — D649 Anemia, unspecified: Secondary | ICD-10-CM | POA: Diagnosis not present

## 2019-07-24 DIAGNOSIS — E785 Hyperlipidemia, unspecified: Secondary | ICD-10-CM | POA: Diagnosis not present

## 2019-07-24 DIAGNOSIS — Z9181 History of falling: Secondary | ICD-10-CM | POA: Diagnosis not present

## 2019-07-24 DIAGNOSIS — C7951 Secondary malignant neoplasm of bone: Secondary | ICD-10-CM | POA: Diagnosis not present

## 2019-07-24 DIAGNOSIS — Z6831 Body mass index (BMI) 31.0-31.9, adult: Secondary | ICD-10-CM | POA: Diagnosis not present

## 2019-08-04 DIAGNOSIS — E559 Vitamin D deficiency, unspecified: Secondary | ICD-10-CM | POA: Diagnosis not present

## 2019-08-04 DIAGNOSIS — I10 Essential (primary) hypertension: Secondary | ICD-10-CM | POA: Diagnosis not present

## 2019-08-04 DIAGNOSIS — K219 Gastro-esophageal reflux disease without esophagitis: Secondary | ICD-10-CM | POA: Diagnosis not present

## 2019-08-04 DIAGNOSIS — E785 Hyperlipidemia, unspecified: Secondary | ICD-10-CM | POA: Diagnosis not present

## 2019-08-21 DIAGNOSIS — C7951 Secondary malignant neoplasm of bone: Secondary | ICD-10-CM | POA: Diagnosis not present

## 2019-08-21 DIAGNOSIS — C782 Secondary malignant neoplasm of pleura: Secondary | ICD-10-CM | POA: Diagnosis not present

## 2019-08-21 DIAGNOSIS — C50419 Malignant neoplasm of upper-outer quadrant of unspecified female breast: Secondary | ICD-10-CM | POA: Diagnosis not present

## 2019-08-21 DIAGNOSIS — C7989 Secondary malignant neoplasm of other specified sites: Secondary | ICD-10-CM | POA: Diagnosis not present

## 2019-08-21 DIAGNOSIS — C50411 Malignant neoplasm of upper-outer quadrant of right female breast: Secondary | ICD-10-CM | POA: Diagnosis not present

## 2019-08-21 DIAGNOSIS — Z170 Estrogen receptor positive status [ER+]: Secondary | ICD-10-CM | POA: Diagnosis not present

## 2019-08-21 DIAGNOSIS — C778 Secondary and unspecified malignant neoplasm of lymph nodes of multiple regions: Secondary | ICD-10-CM | POA: Diagnosis not present

## 2019-09-01 DIAGNOSIS — E559 Vitamin D deficiency, unspecified: Secondary | ICD-10-CM | POA: Diagnosis not present

## 2019-09-01 DIAGNOSIS — E785 Hyperlipidemia, unspecified: Secondary | ICD-10-CM | POA: Diagnosis not present

## 2019-09-01 DIAGNOSIS — K219 Gastro-esophageal reflux disease without esophagitis: Secondary | ICD-10-CM | POA: Diagnosis not present

## 2019-09-01 DIAGNOSIS — I10 Essential (primary) hypertension: Secondary | ICD-10-CM | POA: Diagnosis not present

## 2019-09-10 DIAGNOSIS — M542 Cervicalgia: Secondary | ICD-10-CM | POA: Diagnosis not present

## 2019-09-10 DIAGNOSIS — C50411 Malignant neoplasm of upper-outer quadrant of right female breast: Secondary | ICD-10-CM | POA: Diagnosis not present

## 2019-09-10 DIAGNOSIS — C7951 Secondary malignant neoplasm of bone: Secondary | ICD-10-CM | POA: Diagnosis not present

## 2019-10-23 DIAGNOSIS — C782 Secondary malignant neoplasm of pleura: Secondary | ICD-10-CM | POA: Diagnosis not present

## 2019-10-23 DIAGNOSIS — C384 Malignant neoplasm of pleura: Secondary | ICD-10-CM | POA: Diagnosis not present

## 2019-10-23 DIAGNOSIS — C7989 Secondary malignant neoplasm of other specified sites: Secondary | ICD-10-CM | POA: Diagnosis not present

## 2019-10-23 DIAGNOSIS — C778 Secondary and unspecified malignant neoplasm of lymph nodes of multiple regions: Secondary | ICD-10-CM | POA: Diagnosis not present

## 2019-10-23 DIAGNOSIS — C50411 Malignant neoplasm of upper-outer quadrant of right female breast: Secondary | ICD-10-CM | POA: Diagnosis not present

## 2019-10-23 DIAGNOSIS — C7951 Secondary malignant neoplasm of bone: Secondary | ICD-10-CM | POA: Diagnosis not present

## 2019-11-04 DIAGNOSIS — E559 Vitamin D deficiency, unspecified: Secondary | ICD-10-CM | POA: Diagnosis not present

## 2019-11-04 DIAGNOSIS — I10 Essential (primary) hypertension: Secondary | ICD-10-CM | POA: Diagnosis not present

## 2019-11-04 DIAGNOSIS — E785 Hyperlipidemia, unspecified: Secondary | ICD-10-CM | POA: Diagnosis not present

## 2019-11-04 DIAGNOSIS — K219 Gastro-esophageal reflux disease without esophagitis: Secondary | ICD-10-CM | POA: Diagnosis not present

## 2019-11-20 DIAGNOSIS — C782 Secondary malignant neoplasm of pleura: Secondary | ICD-10-CM | POA: Diagnosis not present

## 2019-11-20 DIAGNOSIS — C778 Secondary and unspecified malignant neoplasm of lymph nodes of multiple regions: Secondary | ICD-10-CM | POA: Diagnosis not present

## 2019-11-20 DIAGNOSIS — C7989 Secondary malignant neoplasm of other specified sites: Secondary | ICD-10-CM | POA: Diagnosis not present

## 2019-11-20 DIAGNOSIS — C50411 Malignant neoplasm of upper-outer quadrant of right female breast: Secondary | ICD-10-CM | POA: Diagnosis not present

## 2019-11-20 DIAGNOSIS — C7951 Secondary malignant neoplasm of bone: Secondary | ICD-10-CM | POA: Diagnosis not present

## 2019-12-02 DIAGNOSIS — E785 Hyperlipidemia, unspecified: Secondary | ICD-10-CM | POA: Diagnosis not present

## 2019-12-02 DIAGNOSIS — I10 Essential (primary) hypertension: Secondary | ICD-10-CM | POA: Diagnosis not present

## 2019-12-02 DIAGNOSIS — E559 Vitamin D deficiency, unspecified: Secondary | ICD-10-CM | POA: Diagnosis not present

## 2019-12-02 DIAGNOSIS — K219 Gastro-esophageal reflux disease without esophagitis: Secondary | ICD-10-CM | POA: Diagnosis not present

## 2019-12-16 DIAGNOSIS — C50411 Malignant neoplasm of upper-outer quadrant of right female breast: Secondary | ICD-10-CM | POA: Diagnosis not present

## 2019-12-16 DIAGNOSIS — C787 Secondary malignant neoplasm of liver and intrahepatic bile duct: Secondary | ICD-10-CM | POA: Diagnosis not present

## 2019-12-16 DIAGNOSIS — C7951 Secondary malignant neoplasm of bone: Secondary | ICD-10-CM | POA: Diagnosis not present

## 2019-12-18 DIAGNOSIS — C384 Malignant neoplasm of pleura: Secondary | ICD-10-CM | POA: Diagnosis not present

## 2019-12-18 DIAGNOSIS — C778 Secondary and unspecified malignant neoplasm of lymph nodes of multiple regions: Secondary | ICD-10-CM | POA: Diagnosis not present

## 2019-12-18 DIAGNOSIS — C50411 Malignant neoplasm of upper-outer quadrant of right female breast: Secondary | ICD-10-CM | POA: Diagnosis not present

## 2019-12-18 DIAGNOSIS — E538 Deficiency of other specified B group vitamins: Secondary | ICD-10-CM | POA: Diagnosis not present

## 2019-12-18 DIAGNOSIS — C7989 Secondary malignant neoplasm of other specified sites: Secondary | ICD-10-CM | POA: Diagnosis not present

## 2019-12-30 DIAGNOSIS — K219 Gastro-esophageal reflux disease without esophagitis: Secondary | ICD-10-CM | POA: Diagnosis not present

## 2019-12-30 DIAGNOSIS — I10 Essential (primary) hypertension: Secondary | ICD-10-CM | POA: Diagnosis not present

## 2019-12-30 DIAGNOSIS — E559 Vitamin D deficiency, unspecified: Secondary | ICD-10-CM | POA: Diagnosis not present

## 2019-12-30 DIAGNOSIS — E785 Hyperlipidemia, unspecified: Secondary | ICD-10-CM | POA: Diagnosis not present

## 2020-01-06 DIAGNOSIS — M85852 Other specified disorders of bone density and structure, left thigh: Secondary | ICD-10-CM | POA: Diagnosis not present

## 2020-01-06 DIAGNOSIS — M8589 Other specified disorders of bone density and structure, multiple sites: Secondary | ICD-10-CM | POA: Diagnosis not present

## 2020-01-18 DIAGNOSIS — E538 Deficiency of other specified B group vitamins: Secondary | ICD-10-CM | POA: Diagnosis not present

## 2020-01-18 DIAGNOSIS — M542 Cervicalgia: Secondary | ICD-10-CM | POA: Diagnosis not present

## 2020-01-18 DIAGNOSIS — M8580 Other specified disorders of bone density and structure, unspecified site: Secondary | ICD-10-CM | POA: Diagnosis not present

## 2020-01-18 DIAGNOSIS — R634 Abnormal weight loss: Secondary | ICD-10-CM | POA: Diagnosis not present

## 2020-01-18 DIAGNOSIS — F419 Anxiety disorder, unspecified: Secondary | ICD-10-CM | POA: Diagnosis not present

## 2020-01-18 DIAGNOSIS — F17210 Nicotine dependence, cigarettes, uncomplicated: Secondary | ICD-10-CM | POA: Diagnosis not present

## 2020-01-18 DIAGNOSIS — C778 Secondary and unspecified malignant neoplasm of lymph nodes of multiple regions: Secondary | ICD-10-CM | POA: Diagnosis not present

## 2020-01-18 DIAGNOSIS — C7951 Secondary malignant neoplasm of bone: Secondary | ICD-10-CM | POA: Diagnosis not present

## 2020-01-18 DIAGNOSIS — C787 Secondary malignant neoplasm of liver and intrahepatic bile duct: Secondary | ICD-10-CM | POA: Diagnosis not present

## 2020-01-18 DIAGNOSIS — Z515 Encounter for palliative care: Secondary | ICD-10-CM | POA: Diagnosis not present

## 2020-01-18 DIAGNOSIS — C50411 Malignant neoplasm of upper-outer quadrant of right female breast: Secondary | ICD-10-CM | POA: Diagnosis not present

## 2020-01-18 DIAGNOSIS — F329 Major depressive disorder, single episode, unspecified: Secondary | ICD-10-CM | POA: Diagnosis not present

## 2020-01-27 DIAGNOSIS — E785 Hyperlipidemia, unspecified: Secondary | ICD-10-CM | POA: Diagnosis not present

## 2020-01-27 DIAGNOSIS — I10 Essential (primary) hypertension: Secondary | ICD-10-CM | POA: Diagnosis not present

## 2020-01-27 DIAGNOSIS — E559 Vitamin D deficiency, unspecified: Secondary | ICD-10-CM | POA: Diagnosis not present

## 2020-01-27 DIAGNOSIS — K219 Gastro-esophageal reflux disease without esophagitis: Secondary | ICD-10-CM | POA: Diagnosis not present

## 2020-02-17 DIAGNOSIS — C7951 Secondary malignant neoplasm of bone: Secondary | ICD-10-CM | POA: Diagnosis not present

## 2020-02-17 DIAGNOSIS — C778 Secondary and unspecified malignant neoplasm of lymph nodes of multiple regions: Secondary | ICD-10-CM | POA: Diagnosis not present

## 2020-02-17 DIAGNOSIS — C7989 Secondary malignant neoplasm of other specified sites: Secondary | ICD-10-CM | POA: Diagnosis not present

## 2020-02-17 DIAGNOSIS — C50411 Malignant neoplasm of upper-outer quadrant of right female breast: Secondary | ICD-10-CM | POA: Diagnosis not present

## 2020-02-17 DIAGNOSIS — E538 Deficiency of other specified B group vitamins: Secondary | ICD-10-CM | POA: Diagnosis not present

## 2020-02-17 DIAGNOSIS — C782 Secondary malignant neoplasm of pleura: Secondary | ICD-10-CM | POA: Diagnosis not present

## 2020-02-24 DIAGNOSIS — I10 Essential (primary) hypertension: Secondary | ICD-10-CM | POA: Diagnosis not present

## 2020-02-24 DIAGNOSIS — E669 Obesity, unspecified: Secondary | ICD-10-CM | POA: Diagnosis not present

## 2020-02-24 DIAGNOSIS — Z6831 Body mass index (BMI) 31.0-31.9, adult: Secondary | ICD-10-CM | POA: Diagnosis not present

## 2020-02-24 DIAGNOSIS — E785 Hyperlipidemia, unspecified: Secondary | ICD-10-CM | POA: Diagnosis not present

## 2020-03-10 DIAGNOSIS — I10 Essential (primary) hypertension: Secondary | ICD-10-CM | POA: Diagnosis not present

## 2020-03-10 DIAGNOSIS — E785 Hyperlipidemia, unspecified: Secondary | ICD-10-CM | POA: Diagnosis not present

## 2020-03-10 DIAGNOSIS — E559 Vitamin D deficiency, unspecified: Secondary | ICD-10-CM | POA: Diagnosis not present

## 2020-03-10 DIAGNOSIS — K219 Gastro-esophageal reflux disease without esophagitis: Secondary | ICD-10-CM | POA: Diagnosis not present

## 2020-04-05 DIAGNOSIS — M8589 Other specified disorders of bone density and structure, multiple sites: Secondary | ICD-10-CM | POA: Diagnosis not present

## 2020-04-05 DIAGNOSIS — R634 Abnormal weight loss: Secondary | ICD-10-CM | POA: Diagnosis not present

## 2020-04-05 DIAGNOSIS — C782 Secondary malignant neoplasm of pleura: Secondary | ICD-10-CM | POA: Diagnosis not present

## 2020-04-05 DIAGNOSIS — C50411 Malignant neoplasm of upper-outer quadrant of right female breast: Secondary | ICD-10-CM | POA: Diagnosis not present

## 2020-04-05 DIAGNOSIS — C778 Secondary and unspecified malignant neoplasm of lymph nodes of multiple regions: Secondary | ICD-10-CM | POA: Diagnosis not present

## 2020-04-05 DIAGNOSIS — C7951 Secondary malignant neoplasm of bone: Secondary | ICD-10-CM | POA: Diagnosis not present

## 2020-04-05 DIAGNOSIS — G893 Neoplasm related pain (acute) (chronic): Secondary | ICD-10-CM | POA: Diagnosis not present

## 2020-04-05 DIAGNOSIS — C7989 Secondary malignant neoplasm of other specified sites: Secondary | ICD-10-CM | POA: Diagnosis not present

## 2020-04-05 DIAGNOSIS — M542 Cervicalgia: Secondary | ICD-10-CM | POA: Diagnosis not present

## 2020-04-05 DIAGNOSIS — E538 Deficiency of other specified B group vitamins: Secondary | ICD-10-CM | POA: Diagnosis not present

## 2020-04-05 DIAGNOSIS — F17210 Nicotine dependence, cigarettes, uncomplicated: Secondary | ICD-10-CM | POA: Diagnosis not present

## 2020-04-11 DIAGNOSIS — C7800 Secondary malignant neoplasm of unspecified lung: Secondary | ICD-10-CM | POA: Diagnosis not present

## 2020-04-11 DIAGNOSIS — C50911 Malignant neoplasm of unspecified site of right female breast: Secondary | ICD-10-CM | POA: Diagnosis not present

## 2020-04-11 DIAGNOSIS — C50411 Malignant neoplasm of upper-outer quadrant of right female breast: Secondary | ICD-10-CM | POA: Diagnosis not present

## 2020-04-11 DIAGNOSIS — Z9011 Acquired absence of right breast and nipple: Secondary | ICD-10-CM | POA: Diagnosis not present

## 2020-04-11 DIAGNOSIS — C384 Malignant neoplasm of pleura: Secondary | ICD-10-CM | POA: Diagnosis not present

## 2020-04-11 DIAGNOSIS — C787 Secondary malignant neoplasm of liver and intrahepatic bile duct: Secondary | ICD-10-CM | POA: Diagnosis not present

## 2020-04-11 DIAGNOSIS — I2510 Atherosclerotic heart disease of native coronary artery without angina pectoris: Secondary | ICD-10-CM | POA: Diagnosis not present

## 2020-04-11 DIAGNOSIS — I700 Atherosclerosis of aorta: Secondary | ICD-10-CM | POA: Diagnosis not present

## 2020-04-11 DIAGNOSIS — C7951 Secondary malignant neoplasm of bone: Secondary | ICD-10-CM | POA: Diagnosis not present

## 2020-04-11 DIAGNOSIS — Z5111 Encounter for antineoplastic chemotherapy: Secondary | ICD-10-CM | POA: Diagnosis not present

## 2020-04-12 DIAGNOSIS — C50411 Malignant neoplasm of upper-outer quadrant of right female breast: Secondary | ICD-10-CM | POA: Diagnosis not present

## 2020-04-12 DIAGNOSIS — C7989 Secondary malignant neoplasm of other specified sites: Secondary | ICD-10-CM | POA: Diagnosis not present

## 2020-04-12 DIAGNOSIS — C782 Secondary malignant neoplasm of pleura: Secondary | ICD-10-CM | POA: Diagnosis not present

## 2020-04-12 DIAGNOSIS — C7951 Secondary malignant neoplasm of bone: Secondary | ICD-10-CM | POA: Diagnosis not present

## 2020-04-12 DIAGNOSIS — C778 Secondary and unspecified malignant neoplasm of lymph nodes of multiple regions: Secondary | ICD-10-CM | POA: Diagnosis not present

## 2020-04-15 DIAGNOSIS — Z5111 Encounter for antineoplastic chemotherapy: Secondary | ICD-10-CM | POA: Diagnosis not present

## 2020-04-15 DIAGNOSIS — C384 Malignant neoplasm of pleura: Secondary | ICD-10-CM | POA: Diagnosis not present

## 2020-04-22 DIAGNOSIS — C50411 Malignant neoplasm of upper-outer quadrant of right female breast: Secondary | ICD-10-CM | POA: Diagnosis not present

## 2020-04-22 DIAGNOSIS — C7989 Secondary malignant neoplasm of other specified sites: Secondary | ICD-10-CM | POA: Diagnosis not present

## 2020-04-22 DIAGNOSIS — E538 Deficiency of other specified B group vitamins: Secondary | ICD-10-CM | POA: Diagnosis not present

## 2020-04-22 DIAGNOSIS — C7951 Secondary malignant neoplasm of bone: Secondary | ICD-10-CM | POA: Diagnosis not present

## 2020-04-22 DIAGNOSIS — C782 Secondary malignant neoplasm of pleura: Secondary | ICD-10-CM | POA: Diagnosis not present

## 2020-04-22 DIAGNOSIS — C778 Secondary and unspecified malignant neoplasm of lymph nodes of multiple regions: Secondary | ICD-10-CM | POA: Diagnosis not present

## 2020-04-22 DIAGNOSIS — C50419 Malignant neoplasm of upper-outer quadrant of unspecified female breast: Secondary | ICD-10-CM | POA: Diagnosis not present

## 2020-05-04 DIAGNOSIS — Z452 Encounter for adjustment and management of vascular access device: Secondary | ICD-10-CM | POA: Diagnosis not present

## 2020-05-04 DIAGNOSIS — Z1159 Encounter for screening for other viral diseases: Secondary | ICD-10-CM | POA: Diagnosis not present

## 2020-05-09 DIAGNOSIS — Z853 Personal history of malignant neoplasm of breast: Secondary | ICD-10-CM | POA: Diagnosis not present

## 2020-05-09 DIAGNOSIS — F17210 Nicotine dependence, cigarettes, uncomplicated: Secondary | ICD-10-CM | POA: Diagnosis not present

## 2020-05-09 DIAGNOSIS — S2243XA Multiple fractures of ribs, bilateral, initial encounter for closed fracture: Secondary | ICD-10-CM | POA: Diagnosis not present

## 2020-05-09 DIAGNOSIS — M8580 Other specified disorders of bone density and structure, unspecified site: Secondary | ICD-10-CM | POA: Diagnosis not present

## 2020-05-09 DIAGNOSIS — Z452 Encounter for adjustment and management of vascular access device: Secondary | ICD-10-CM | POA: Diagnosis not present

## 2020-05-09 DIAGNOSIS — C50919 Malignant neoplasm of unspecified site of unspecified female breast: Secondary | ICD-10-CM | POA: Diagnosis not present

## 2020-05-09 DIAGNOSIS — F329 Major depressive disorder, single episode, unspecified: Secondary | ICD-10-CM | POA: Diagnosis not present

## 2020-05-09 DIAGNOSIS — I10 Essential (primary) hypertension: Secondary | ICD-10-CM | POA: Diagnosis not present

## 2020-05-10 DIAGNOSIS — E559 Vitamin D deficiency, unspecified: Secondary | ICD-10-CM | POA: Diagnosis not present

## 2020-05-10 DIAGNOSIS — E118 Type 2 diabetes mellitus with unspecified complications: Secondary | ICD-10-CM | POA: Diagnosis not present

## 2020-05-10 DIAGNOSIS — K219 Gastro-esophageal reflux disease without esophagitis: Secondary | ICD-10-CM | POA: Diagnosis not present

## 2020-05-10 DIAGNOSIS — M791 Myalgia, unspecified site: Secondary | ICD-10-CM | POA: Diagnosis not present

## 2020-05-10 DIAGNOSIS — F419 Anxiety disorder, unspecified: Secondary | ICD-10-CM | POA: Diagnosis not present

## 2020-05-10 DIAGNOSIS — E785 Hyperlipidemia, unspecified: Secondary | ICD-10-CM | POA: Diagnosis not present

## 2020-05-10 DIAGNOSIS — Z6831 Body mass index (BMI) 31.0-31.9, adult: Secondary | ICD-10-CM | POA: Diagnosis not present

## 2020-05-10 DIAGNOSIS — I10 Essential (primary) hypertension: Secondary | ICD-10-CM | POA: Diagnosis not present

## 2020-05-10 DIAGNOSIS — F3341 Major depressive disorder, recurrent, in partial remission: Secondary | ICD-10-CM | POA: Diagnosis not present

## 2020-05-10 DIAGNOSIS — E669 Obesity, unspecified: Secondary | ICD-10-CM | POA: Diagnosis not present

## 2020-05-10 DIAGNOSIS — C50919 Malignant neoplasm of unspecified site of unspecified female breast: Secondary | ICD-10-CM | POA: Diagnosis not present

## 2020-05-13 DIAGNOSIS — C782 Secondary malignant neoplasm of pleura: Secondary | ICD-10-CM | POA: Diagnosis not present

## 2020-05-13 DIAGNOSIS — E538 Deficiency of other specified B group vitamins: Secondary | ICD-10-CM | POA: Diagnosis not present

## 2020-05-13 DIAGNOSIS — C778 Secondary and unspecified malignant neoplasm of lymph nodes of multiple regions: Secondary | ICD-10-CM | POA: Diagnosis not present

## 2020-05-13 DIAGNOSIS — C50411 Malignant neoplasm of upper-outer quadrant of right female breast: Secondary | ICD-10-CM | POA: Diagnosis not present

## 2020-05-13 DIAGNOSIS — C7989 Secondary malignant neoplasm of other specified sites: Secondary | ICD-10-CM | POA: Diagnosis not present

## 2020-05-13 DIAGNOSIS — C7951 Secondary malignant neoplasm of bone: Secondary | ICD-10-CM | POA: Diagnosis not present

## 2020-05-25 DIAGNOSIS — C384 Malignant neoplasm of pleura: Secondary | ICD-10-CM | POA: Diagnosis not present

## 2020-05-25 DIAGNOSIS — C7951 Secondary malignant neoplasm of bone: Secondary | ICD-10-CM | POA: Diagnosis not present

## 2020-05-25 DIAGNOSIS — C50411 Malignant neoplasm of upper-outer quadrant of right female breast: Secondary | ICD-10-CM | POA: Diagnosis not present

## 2020-05-25 DIAGNOSIS — C778 Secondary and unspecified malignant neoplasm of lymph nodes of multiple regions: Secondary | ICD-10-CM | POA: Diagnosis not present

## 2020-05-25 DIAGNOSIS — C7989 Secondary malignant neoplasm of other specified sites: Secondary | ICD-10-CM | POA: Diagnosis not present

## 2020-05-25 DIAGNOSIS — C782 Secondary malignant neoplasm of pleura: Secondary | ICD-10-CM | POA: Diagnosis not present

## 2020-06-07 DIAGNOSIS — E785 Hyperlipidemia, unspecified: Secondary | ICD-10-CM | POA: Diagnosis not present

## 2020-06-07 DIAGNOSIS — K219 Gastro-esophageal reflux disease without esophagitis: Secondary | ICD-10-CM | POA: Diagnosis not present

## 2020-06-07 DIAGNOSIS — G2581 Restless legs syndrome: Secondary | ICD-10-CM | POA: Diagnosis not present

## 2020-06-07 DIAGNOSIS — E669 Obesity, unspecified: Secondary | ICD-10-CM | POA: Diagnosis not present

## 2020-06-07 DIAGNOSIS — E118 Type 2 diabetes mellitus with unspecified complications: Secondary | ICD-10-CM | POA: Diagnosis not present

## 2020-06-07 DIAGNOSIS — Z6831 Body mass index (BMI) 31.0-31.9, adult: Secondary | ICD-10-CM | POA: Diagnosis not present

## 2020-06-07 DIAGNOSIS — F419 Anxiety disorder, unspecified: Secondary | ICD-10-CM | POA: Diagnosis not present

## 2020-06-07 DIAGNOSIS — C50919 Malignant neoplasm of unspecified site of unspecified female breast: Secondary | ICD-10-CM | POA: Diagnosis not present

## 2020-06-07 DIAGNOSIS — I10 Essential (primary) hypertension: Secondary | ICD-10-CM | POA: Diagnosis not present

## 2020-06-07 DIAGNOSIS — M791 Myalgia, unspecified site: Secondary | ICD-10-CM | POA: Diagnosis not present

## 2020-06-07 DIAGNOSIS — E559 Vitamin D deficiency, unspecified: Secondary | ICD-10-CM | POA: Diagnosis not present

## 2020-06-07 DIAGNOSIS — F3341 Major depressive disorder, recurrent, in partial remission: Secondary | ICD-10-CM | POA: Diagnosis not present

## 2020-06-10 DIAGNOSIS — C50411 Malignant neoplasm of upper-outer quadrant of right female breast: Secondary | ICD-10-CM | POA: Diagnosis not present

## 2020-06-10 DIAGNOSIS — F17210 Nicotine dependence, cigarettes, uncomplicated: Secondary | ICD-10-CM | POA: Diagnosis not present

## 2020-06-10 DIAGNOSIS — R5383 Other fatigue: Secondary | ICD-10-CM | POA: Diagnosis not present

## 2020-06-10 DIAGNOSIS — J910 Malignant pleural effusion: Secondary | ICD-10-CM | POA: Diagnosis not present

## 2020-06-10 DIAGNOSIS — C7951 Secondary malignant neoplasm of bone: Secondary | ICD-10-CM | POA: Diagnosis not present

## 2020-06-10 DIAGNOSIS — F418 Other specified anxiety disorders: Secondary | ICD-10-CM | POA: Diagnosis not present

## 2020-06-10 DIAGNOSIS — M8589 Other specified disorders of bone density and structure, multiple sites: Secondary | ICD-10-CM | POA: Diagnosis not present

## 2020-06-10 DIAGNOSIS — R634 Abnormal weight loss: Secondary | ICD-10-CM | POA: Diagnosis not present

## 2020-06-10 DIAGNOSIS — C787 Secondary malignant neoplasm of liver and intrahepatic bile duct: Secondary | ICD-10-CM | POA: Diagnosis not present

## 2020-06-10 DIAGNOSIS — E538 Deficiency of other specified B group vitamins: Secondary | ICD-10-CM | POA: Diagnosis not present

## 2020-06-19 ENCOUNTER — Encounter: Payer: Self-pay | Admitting: Oncology

## 2020-06-19 DIAGNOSIS — C50411 Malignant neoplasm of upper-outer quadrant of right female breast: Secondary | ICD-10-CM

## 2020-06-21 DIAGNOSIS — E669 Obesity, unspecified: Secondary | ICD-10-CM | POA: Diagnosis not present

## 2020-06-21 DIAGNOSIS — F419 Anxiety disorder, unspecified: Secondary | ICD-10-CM | POA: Diagnosis not present

## 2020-06-21 DIAGNOSIS — Z6831 Body mass index (BMI) 31.0-31.9, adult: Secondary | ICD-10-CM | POA: Diagnosis not present

## 2020-06-21 DIAGNOSIS — I10 Essential (primary) hypertension: Secondary | ICD-10-CM | POA: Diagnosis not present

## 2020-06-21 DIAGNOSIS — F3341 Major depressive disorder, recurrent, in partial remission: Secondary | ICD-10-CM | POA: Diagnosis not present

## 2020-06-21 DIAGNOSIS — K219 Gastro-esophageal reflux disease without esophagitis: Secondary | ICD-10-CM | POA: Diagnosis not present

## 2020-06-21 DIAGNOSIS — E785 Hyperlipidemia, unspecified: Secondary | ICD-10-CM | POA: Diagnosis not present

## 2020-06-21 DIAGNOSIS — C50919 Malignant neoplasm of unspecified site of unspecified female breast: Secondary | ICD-10-CM | POA: Diagnosis not present

## 2020-06-21 DIAGNOSIS — M791 Myalgia, unspecified site: Secondary | ICD-10-CM | POA: Diagnosis not present

## 2020-06-21 DIAGNOSIS — E118 Type 2 diabetes mellitus with unspecified complications: Secondary | ICD-10-CM | POA: Diagnosis not present

## 2020-06-21 DIAGNOSIS — E559 Vitamin D deficiency, unspecified: Secondary | ICD-10-CM | POA: Diagnosis not present

## 2020-06-22 DIAGNOSIS — E538 Deficiency of other specified B group vitamins: Secondary | ICD-10-CM | POA: Diagnosis not present

## 2020-06-22 DIAGNOSIS — C778 Secondary and unspecified malignant neoplasm of lymph nodes of multiple regions: Secondary | ICD-10-CM | POA: Diagnosis not present

## 2020-06-22 DIAGNOSIS — C782 Secondary malignant neoplasm of pleura: Secondary | ICD-10-CM | POA: Diagnosis not present

## 2020-06-22 DIAGNOSIS — C7951 Secondary malignant neoplasm of bone: Secondary | ICD-10-CM | POA: Diagnosis not present

## 2020-06-22 DIAGNOSIS — C50411 Malignant neoplasm of upper-outer quadrant of right female breast: Secondary | ICD-10-CM | POA: Diagnosis not present

## 2020-06-22 DIAGNOSIS — C7989 Secondary malignant neoplasm of other specified sites: Secondary | ICD-10-CM | POA: Diagnosis not present

## 2020-06-26 ENCOUNTER — Encounter: Payer: Self-pay | Admitting: Pharmacist

## 2020-06-26 DIAGNOSIS — C7951 Secondary malignant neoplasm of bone: Secondary | ICD-10-CM | POA: Insufficient documentation

## 2020-06-26 DIAGNOSIS — D519 Vitamin B12 deficiency anemia, unspecified: Secondary | ICD-10-CM | POA: Insufficient documentation

## 2020-06-26 DIAGNOSIS — Z170 Estrogen receptor positive status [ER+]: Secondary | ICD-10-CM

## 2020-06-28 ENCOUNTER — Encounter: Payer: Self-pay | Admitting: Pharmacist

## 2020-06-29 DIAGNOSIS — C7951 Secondary malignant neoplasm of bone: Secondary | ICD-10-CM | POA: Diagnosis not present

## 2020-06-29 DIAGNOSIS — J910 Malignant pleural effusion: Secondary | ICD-10-CM | POA: Diagnosis not present

## 2020-06-29 DIAGNOSIS — C782 Secondary malignant neoplasm of pleura: Secondary | ICD-10-CM | POA: Diagnosis not present

## 2020-06-29 DIAGNOSIS — C384 Malignant neoplasm of pleura: Secondary | ICD-10-CM | POA: Diagnosis not present

## 2020-06-29 DIAGNOSIS — E538 Deficiency of other specified B group vitamins: Secondary | ICD-10-CM | POA: Diagnosis not present

## 2020-06-29 DIAGNOSIS — C778 Secondary and unspecified malignant neoplasm of lymph nodes of multiple regions: Secondary | ICD-10-CM | POA: Diagnosis not present

## 2020-07-05 DIAGNOSIS — Z6831 Body mass index (BMI) 31.0-31.9, adult: Secondary | ICD-10-CM | POA: Diagnosis not present

## 2020-07-05 DIAGNOSIS — M791 Myalgia, unspecified site: Secondary | ICD-10-CM | POA: Diagnosis not present

## 2020-07-05 DIAGNOSIS — K219 Gastro-esophageal reflux disease without esophagitis: Secondary | ICD-10-CM | POA: Diagnosis not present

## 2020-07-05 DIAGNOSIS — E669 Obesity, unspecified: Secondary | ICD-10-CM | POA: Diagnosis not present

## 2020-07-05 DIAGNOSIS — E118 Type 2 diabetes mellitus with unspecified complications: Secondary | ICD-10-CM | POA: Diagnosis not present

## 2020-07-05 DIAGNOSIS — E559 Vitamin D deficiency, unspecified: Secondary | ICD-10-CM | POA: Diagnosis not present

## 2020-07-05 DIAGNOSIS — C50919 Malignant neoplasm of unspecified site of unspecified female breast: Secondary | ICD-10-CM | POA: Diagnosis not present

## 2020-07-05 DIAGNOSIS — F419 Anxiety disorder, unspecified: Secondary | ICD-10-CM | POA: Diagnosis not present

## 2020-07-05 DIAGNOSIS — E785 Hyperlipidemia, unspecified: Secondary | ICD-10-CM | POA: Diagnosis not present

## 2020-07-05 DIAGNOSIS — F3341 Major depressive disorder, recurrent, in partial remission: Secondary | ICD-10-CM | POA: Diagnosis not present

## 2020-07-05 DIAGNOSIS — I10 Essential (primary) hypertension: Secondary | ICD-10-CM | POA: Diagnosis not present

## 2020-07-06 ENCOUNTER — Encounter: Payer: Self-pay | Admitting: Pharmacist

## 2020-07-11 ENCOUNTER — Other Ambulatory Visit: Payer: Self-pay | Admitting: Hematology and Oncology

## 2020-07-11 DIAGNOSIS — C782 Secondary malignant neoplasm of pleura: Secondary | ICD-10-CM | POA: Diagnosis not present

## 2020-07-11 DIAGNOSIS — C7989 Secondary malignant neoplasm of other specified sites: Secondary | ICD-10-CM | POA: Diagnosis not present

## 2020-07-11 DIAGNOSIS — C778 Secondary and unspecified malignant neoplasm of lymph nodes of multiple regions: Secondary | ICD-10-CM | POA: Diagnosis not present

## 2020-07-11 DIAGNOSIS — J910 Malignant pleural effusion: Secondary | ICD-10-CM | POA: Diagnosis not present

## 2020-07-11 DIAGNOSIS — C7951 Secondary malignant neoplasm of bone: Secondary | ICD-10-CM | POA: Diagnosis not present

## 2020-07-11 DIAGNOSIS — C787 Secondary malignant neoplasm of liver and intrahepatic bile duct: Secondary | ICD-10-CM | POA: Diagnosis not present

## 2020-07-11 DIAGNOSIS — E538 Deficiency of other specified B group vitamins: Secondary | ICD-10-CM | POA: Diagnosis not present

## 2020-07-11 DIAGNOSIS — F17210 Nicotine dependence, cigarettes, uncomplicated: Secondary | ICD-10-CM | POA: Diagnosis not present

## 2020-07-11 DIAGNOSIS — D519 Vitamin B12 deficiency anemia, unspecified: Secondary | ICD-10-CM | POA: Diagnosis not present

## 2020-07-11 DIAGNOSIS — C50411 Malignant neoplasm of upper-outer quadrant of right female breast: Secondary | ICD-10-CM | POA: Diagnosis not present

## 2020-07-11 DIAGNOSIS — Z853 Personal history of malignant neoplasm of breast: Secondary | ICD-10-CM | POA: Diagnosis not present

## 2020-07-11 LAB — CBC AND DIFFERENTIAL
HCT: 41 (ref 36–46)
Hemoglobin: 13.6 (ref 12.0–16.0)
Neutrophils Absolute: 1580
Platelets: 193 (ref 150–399)
WBC: 4.5

## 2020-07-11 LAB — HEPATIC FUNCTION PANEL
ALT: 54 — AB (ref 7–35)
AST: 152 — AB (ref 13–35)
Alkaline Phosphatase: 232 — AB (ref 25–125)
Bilirubin, Total: 0.9

## 2020-07-11 LAB — BASIC METABOLIC PANEL WITH GFR
BUN: 6 (ref 4–21)
CO2: 25 — AB (ref 13–22)
Chloride: 102 (ref 99–108)
Creatinine: 0.6 (ref 0.5–1.1)
Glucose: 164
Potassium: 2.9 — AB (ref 3.4–5.3)
Sodium: 139 (ref 137–147)

## 2020-07-11 LAB — COMPREHENSIVE METABOLIC PANEL WITH GFR
Albumin: 3.7 (ref 3.5–5.0)
Calcium: 9.2 (ref 8.7–10.7)

## 2020-07-11 LAB — CBC: RBC: 3.99 (ref 3.87–5.11)

## 2020-07-13 ENCOUNTER — Other Ambulatory Visit: Payer: Self-pay | Admitting: Hematology and Oncology

## 2020-07-13 MED FILL — Gemcitabine HCl Inj 1 GM/26.3ML (38 MG/ML) (Base Equiv): INTRAVENOUS | Qty: 31 | Status: AC

## 2020-07-14 ENCOUNTER — Inpatient Hospital Stay: Payer: Medicare Other

## 2020-07-14 ENCOUNTER — Other Ambulatory Visit: Payer: Self-pay | Admitting: Hematology and Oncology

## 2020-08-03 DIAGNOSIS — J90 Pleural effusion, not elsewhere classified: Secondary | ICD-10-CM | POA: Diagnosis not present

## 2020-08-03 DIAGNOSIS — N281 Cyst of kidney, acquired: Secondary | ICD-10-CM | POA: Diagnosis not present

## 2020-08-03 DIAGNOSIS — K769 Liver disease, unspecified: Secondary | ICD-10-CM | POA: Diagnosis not present

## 2020-08-03 DIAGNOSIS — C50411 Malignant neoplasm of upper-outer quadrant of right female breast: Secondary | ICD-10-CM | POA: Diagnosis not present

## 2020-08-03 DIAGNOSIS — C787 Secondary malignant neoplasm of liver and intrahepatic bile duct: Secondary | ICD-10-CM | POA: Diagnosis not present

## 2020-08-03 DIAGNOSIS — C50919 Malignant neoplasm of unspecified site of unspecified female breast: Secondary | ICD-10-CM | POA: Diagnosis not present

## 2020-08-03 DIAGNOSIS — C7951 Secondary malignant neoplasm of bone: Secondary | ICD-10-CM | POA: Diagnosis not present

## 2020-08-03 DIAGNOSIS — C782 Secondary malignant neoplasm of pleura: Secondary | ICD-10-CM | POA: Diagnosis not present

## 2020-08-04 ENCOUNTER — Other Ambulatory Visit: Payer: Self-pay | Admitting: Hematology and Oncology

## 2020-08-08 DIAGNOSIS — K219 Gastro-esophageal reflux disease without esophagitis: Secondary | ICD-10-CM | POA: Diagnosis not present

## 2020-08-08 DIAGNOSIS — C50919 Malignant neoplasm of unspecified site of unspecified female breast: Secondary | ICD-10-CM | POA: Diagnosis not present

## 2020-08-08 DIAGNOSIS — E669 Obesity, unspecified: Secondary | ICD-10-CM | POA: Diagnosis not present

## 2020-08-08 DIAGNOSIS — I10 Essential (primary) hypertension: Secondary | ICD-10-CM | POA: Diagnosis not present

## 2020-08-08 DIAGNOSIS — E559 Vitamin D deficiency, unspecified: Secondary | ICD-10-CM | POA: Diagnosis not present

## 2020-08-08 DIAGNOSIS — E785 Hyperlipidemia, unspecified: Secondary | ICD-10-CM | POA: Diagnosis not present

## 2020-08-08 DIAGNOSIS — M791 Myalgia, unspecified site: Secondary | ICD-10-CM | POA: Diagnosis not present

## 2020-08-08 DIAGNOSIS — F3341 Major depressive disorder, recurrent, in partial remission: Secondary | ICD-10-CM | POA: Diagnosis not present

## 2020-08-08 DIAGNOSIS — F419 Anxiety disorder, unspecified: Secondary | ICD-10-CM | POA: Diagnosis not present

## 2020-08-08 DIAGNOSIS — Z6831 Body mass index (BMI) 31.0-31.9, adult: Secondary | ICD-10-CM | POA: Diagnosis not present

## 2020-08-08 DIAGNOSIS — E118 Type 2 diabetes mellitus with unspecified complications: Secondary | ICD-10-CM | POA: Diagnosis not present

## 2020-08-10 ENCOUNTER — Other Ambulatory Visit: Payer: Self-pay | Admitting: Hematology and Oncology

## 2020-08-10 DIAGNOSIS — C7951 Secondary malignant neoplasm of bone: Secondary | ICD-10-CM

## 2020-08-11 ENCOUNTER — Other Ambulatory Visit: Payer: Self-pay

## 2020-08-11 DIAGNOSIS — C50411 Malignant neoplasm of upper-outer quadrant of right female breast: Secondary | ICD-10-CM

## 2020-08-11 DIAGNOSIS — M85852 Other specified disorders of bone density and structure, left thigh: Secondary | ICD-10-CM

## 2020-08-11 DIAGNOSIS — C7952 Secondary malignant neoplasm of bone marrow: Secondary | ICD-10-CM

## 2020-08-11 DIAGNOSIS — Z170 Estrogen receptor positive status [ER+]: Secondary | ICD-10-CM

## 2020-08-11 DIAGNOSIS — C7951 Secondary malignant neoplasm of bone: Secondary | ICD-10-CM

## 2020-08-12 ENCOUNTER — Telehealth: Payer: Self-pay

## 2020-08-12 ENCOUNTER — Telehealth: Payer: Self-pay | Admitting: Oncology

## 2020-08-12 NOTE — Telephone Encounter (Signed)
Patient called to reschedule Follow up - Next 12/6 schedule patient.  Sent to Nurses Line to see if she can be scheduled sooner by her condition

## 2020-08-12 NOTE — Telephone Encounter (Signed)
Pt called, asking for the completed forms for PCA Assistant. A female in the background, states they have brought them twice. [phone]

## 2020-08-23 ENCOUNTER — Telehealth: Payer: Self-pay | Admitting: Oncology

## 2020-08-23 ENCOUNTER — Telehealth: Payer: Self-pay | Admitting: Hematology and Oncology

## 2020-08-23 ENCOUNTER — Other Ambulatory Visit: Payer: Self-pay | Admitting: Hematology and Oncology

## 2020-08-23 DIAGNOSIS — C7951 Secondary malignant neoplasm of bone: Secondary | ICD-10-CM

## 2020-08-23 DIAGNOSIS — C50411 Malignant neoplasm of upper-outer quadrant of right female breast: Secondary | ICD-10-CM

## 2020-08-23 DIAGNOSIS — Z170 Estrogen receptor positive status [ER+]: Secondary | ICD-10-CM

## 2020-08-23 NOTE — Telephone Encounter (Signed)
Per 11/12 Staff Message please add 12/6 Lab Appointment.  Left Message with patient/Mailed Appt Summary

## 2020-08-29 ENCOUNTER — Other Ambulatory Visit: Payer: Self-pay | Admitting: Hematology and Oncology

## 2020-08-29 DIAGNOSIS — C787 Secondary malignant neoplasm of liver and intrahepatic bile duct: Secondary | ICD-10-CM

## 2020-08-29 DIAGNOSIS — C7951 Secondary malignant neoplasm of bone: Secondary | ICD-10-CM

## 2020-08-29 DIAGNOSIS — C7800 Secondary malignant neoplasm of unspecified lung: Secondary | ICD-10-CM

## 2020-09-02 NOTE — Progress Notes (Incomplete)
[facility]  [address] [place],  [place]  [address] [phone]  Clinic Day:  09/02/2020  Referring physician: Welford Roche, NP   This document serves as a record of services personally performed by Hosie Poisson, MD. It was created on their behalf by Curry,Lauren E, a trained medical scribe. The creation of this record is based on the scribe's personal observations and the provider's statements to them.   CHIEF COMPLAINT:  CC: Recurrent breast cancer  Current Treatment:  Palliative gemcitabine/vinorelbine every 2 weeks and monthly denosumab   HISTORY OF PRESENT ILLNESS:  Shannon Garcia is a 54 y.o. female with a complicated oncologic history.  She was initially diagnosed with stage IIIA hormone receptor positive breast cancer in March 2011. She was treated with right modified radical mastectomy.  She received adjuvant chemotherapy with doxorubicin/cyclophosphamide for 4 cycles, followed by weekly paclitaxel for 8/12 cycles.  Paclitaxel was discontinued early due to severe neuropathy.  She received adjuvant radiation to the right chest wall.  She was placed on adjuvant hormonal therapy with anastrozole in January 2012, she did not tolerate anastrozole due to vaginal discharge, so discontinued this on her own.  We then placed her on exemestane 25 mg daily in January 2013, but she also discontinued this on her own due to hot flashes.  In August 2016, she is found to have a large left pleural effusion.  Thoracentesis revealed malignant cells consistent with adenocarcinoma, which were ER positive, felt to represent recurrent breast cancer.  She was initially treated with weekly Abraxane, then placed on maintenance letrozole/palbociclib in December 2017. She also has B12 deficiency, for which she continues B12 injections monthly.  She was found to have bone metastasis in October 2019, so letrozole/palbociclib was discontinued.  We placed her on  exemestane/everolimus, but she did not tolerate everolimus due to severe toxicities.  We had her continue exemestane alone.  We recommended placing her on denosumab due to the bone metastasis, but she was hesitant to do so.  She was then hospitalized in December with an overdose of lorazepam.  Everolimus was discontinued and we continued exemestane alone.    She started denosumab treatments in January of 2020.  Her disease had been fairly stable until April 2020, when she presented with severe neck pain and an increase in her CA 27-29 to 109. CT brain and C-spine revealed increased skull metastasis, but no intracranial disease.  There was metastatic disease with a pathologic fracture at C7.  The hormonal therapy was then switched to fulvestrant.  She has continued on denosumab monthly.  She received palliative radiation therapy to the C7 vertebral body, completed in April.  She has had a continued increase in the CA 27-29 despite fulvestrant up to 161.5 in May and 267.9 in June.  She was seen in the emergency department due to pain, at which time CT chest, abdomen and pelvis revealed stable disease in the lungs, bones and liver.  When we saw her in June 2020, she had severe pain of her neck despite hydrocodone/APAP 10/325 every 4 hours around the clock.  She has tried multiple analgesics over the years, including oxycodone, MS Contin 15 mg, fentanyl 25 mcg/hour, tramadol, and methadone.  Usually these opioids help at first and then she develops toxicities.  Due to significant anxiety, Dr. Truman Hayward placed her back on lorazepam.  Her genomic testing was negative for the PIK3CA mutation, so she is not be eligible for treatment with alpelisib.  The CA  27-29 has continued to increase and was up to 337.9 in July and up to 424.7 in August.  Repeat CT imaging in August, however, showed stable disease, so we have continued fulvestrant and denosumab. She was switched to alprazolam 1 mg as needed for anxiety.  The CA 27-29 has  continued to slowly increase and was 488.8 in September and 510 in October, and 634.6 in November.  CT scans in December were relatively stable to mildly worse.    The CA 27-29 was down to 543.5 in December, then increased to 847.4 in January, and 858.5 in February.  She has reported using occasional recreational marijuana to improve her sleep and anxiety.  Her pain was controlled with methadone 10 mg at bedtime and hydrocodone as needed for breakthrough pain, but she stopped methadone due to nausea.  We recommended she decrease the methadone to 5 mg at bedtime, and take some nausea medicine about 20 minutes prior to her methadone.  She had stopped her calcium/vitamin-D supplement, so we recommended she resume that.  CT imaging in March of 2021 was fairly stable.  The thoracic nodal metastasis were similar, as well as the osseous metastasis.  The hepatic metastases were stable.  Borderline and mild abdominal adenopathy, with mild enlargement of at least 1 node, was observed.  The CA 27-29 had decreased to 620.6 in March, from 858.5 in February.  At that time, she had injured her Port-A-Cath site, but it appeared to be healing.  Bone density scan in April was stable to improved with a T-score of -0.7 in the spine, previously -1.4 and a T-score of -1.2 in the femur, which was stable.  She had lost a significant amount weight at her visit in April, and the [place] had increased back up to 818.7 in April. This increased to 1010 in May, so repeat imaging was recommended prior to her next appointment.  She canceled her appointments in June due to the loss of a good friend.  She was seen on July 6th with report of increased nausea and vomiting, not relieved by oral ondansetron, prochlorperazine or promethazine, as well as several episodes of diarrhea.  She was felt to possibly have a viral gastroenteritis.  She received IV fluids and antiemetics with improvement.  CT chest, abdomen and pelvis from July 12th reveals a  mixed response with an increase in the left paraesophageal mass/lymphadenopathy and pleural based nodules, mildly increased abdominal lymphadenopathy, enlargement of some liver lesions, but a decrease in the size and conspicuity of other liver lesions, and stable osseous metastasis.  The CA 27-29 increased to 1087.8.  We met with her and recommended that we change to a more aggressive form of chemotherapy and the patient was agreeable.  She was therefore started on gemcitabine/vinorelbine on July 16th and also received her monthly denosumab and B12 injections at that time.  She had anemia and thrombocytopenia after her 1st cycle, as well as significant elevation of the liver transaminases. so the doses of chemotherapy was decreased by 20% with her 2nd cycle.  Her 2nd cycle of chemotherapy was delayed until August 13th after she sustained an injury to her Port-A-Cath and it had to be removed and replaced.  At her last visit, she complained of severe fatigue, as well as hair loss, felt to be secondary to chemotherapy. She had stopped her methadone because she did not like the way it made her feel.  She was using quite a bit of Tylenol, so decreased her use of  this.  In the past she did not tolerate oxycodone, fentanyl or morphine.  We have wanted to avoid ibuprofen as she has had intermittent thrombocytopenia and may have increased risk for bleeding.  The last CA 27-29 was still increasing at 1114.9 on August 25th.  There was mild wound dehiscence at the previous Port-A-Cath site in the right upper chest wall at her visit on September 10th.  She was instructed to keep this area clean and apply Neosporin to it at least daily.  She had talked to Dr. Truman Hayward about methylphenidate and he felt this may be an option for her.   Dr. Truman Hayward placed her on something for restless leg syndrome that she does not recall the name of.  We delayed her 5th cycle gemcitabine/vinorelbine 1 week due to increased fatigue and mild neutropenia.  The  CA 27-29 came down to 989.2 in September.  She is here today prior to a 6th cycle of gemcitabine/vinorelbine and states she tolerated the last cycle of chemotherapy without significant difficulty.  She denies severe fatigue.  She states she didn't sleep well last night, but otherwise has been sleeping fairly well.  She reports a nervous stomach, with nausea and vomiting on Friday, which has resolved.  She tends toward constipation, for which she takes a stool softener daily and uses a laxative as needed.  She denies abdominal pain.  She denies fevers or chills.  She denies sore throat, cough, dyspnea or chest pain.  She states her pain is well controlled with hydrocodone/APAP 10/325, 2 tablets twice a day.  She states she has been out of her potassium supplement for several days, but is picking it up today.  She still has not had her COVID-19 vaccines.   INTERVAL HISTORY:  Neva is here for routine follow up ***.  CT chest, abdomen and pelvis from November 3rd revealed continued interval progression of right-sided pleural metastases with new small right pleural effusion. Similar appearance of left paraesophageal soft tissue lesion with similar appearance of mediastinal and right hilar lymphadenopathy.  Progression of hepatic metastases with new tiny hypodensities in the liver parenchyma suggesting new metastatic involvement in the liver.  Continued mild progression of upper abdominal and retroperitoneal lymphadenopathy. Similar appearance of multiple mixed density thoracic spine and bone lesions consistent with metastatic involvement.    Her  appetite is good, and she has gained/lost _ pounds since her last visit.  She denies fever, chills or other signs of infection.  She denies nausea, vomiting, bowel issues, or abdominal pain.  She denies sore throat, cough, dyspnea, or chest pain.   REVIEW OF SYSTEMS:  Review of Systems - Oncology   VITALS:  There were no vitals taken for this visit.  Wt Readings  from Last 3 Encounters:  06/26/20 158 lb 8 oz (71.9 kg)    There is no height or weight on file to calculate BMI.  Performance status (ECOG): {CHL ONC [id number]  PHYSICAL EXAM:  Physical Exam  LABS:   CBC Latest Ref Rng & Units 07/11/2020  WBC - 4.5  Hemoglobin 12.0 - 16.0 13.6  Hematocrit 36 - 46 41  Platelets 150 - 399 193   CMP Latest Ref Rng & Units 07/11/2020  BUN 4 - 21 6  Creatinine 0.5 - 1.1 0.6  Sodium 137 - 147 139  Potassium 3.4 - 5.3 2.9(A)  Chloride 99 - 108 102  CO2 13 - 22 25(A)  Calcium 8.7 - 10.7 9.2  Alkaline Phos 25 - 125 232(A)  AST 13 - 35 152(A)  ALT 7 - 35 54(A)     No results found for: CEA1 / No results found for: CEA1 No results found for: PSA1 No results found for: OVZ858 No results found for: CAN125  No results found for: TOTALPROTELP, ALBUMINELP, A1GS, A2GS, BETS, BETA2SER, GAMS, MSPIKE, SPEI No results found for: TIBC, FERRITIN, IRONPCTSAT No results found for: LDH   STUDIES:   She underwent a CT chest, abdomen and pelvis on 08/03/2020 showing: 1. Continued interval progression of right-sided pleural metastases with new small right pleural effusion. 2. Similar appearance of left paraesophageal soft tissue lesion with similar appearance of mediastinal and right hilar lymphadenopathy. 3. Progression of hepatic metastases with new tiny hypodensities in the liver parenchyma suggesting new metastatic involvement in the liver. 4. Continued mild progression of upper abdominal and retroperitoneal lymphadenopathy. 5. Similar appearance of multiple mixed density thoracic spine and bone lesions consistent with metastatic involvement. 6. Small volume free fluid in the pelvis. 7. Aortic Atherosclerosis (ICD10-I70.0).   Allergies:  Allergies  Allergen Reactions  . Wasp Venom Other (See Comments)    Unknown  . Azithromycin Rash    Current Medications: Current Outpatient Medications  Medication Sig Dispense Refill  .  HYDROcodone-acetaminophen (NORCO) 10-325 MG tablet TAKE ONE TABLET BY MOUTH EVERY 4 HOURS AS NEEDED FOR PAIN 120 tablet 0  . methadone (DOLOPHINE) 10 MG tablet TAKE 1/2 TABLET EVERY 12 HOURS 30 tablet 0   No current facility-administered medications for this visit.     ASSESSMENT & PLAN:   Assessment:   1. History of multifocal stage IIIA estrogen receptor positive breast cancer in March 2011, treated with surgery, chemotherapy, radiation, and partial hormonal therapy.  2. Recurrent breast cancer with a large left pleural effusion in August 2016. She was treated with palliative Abraxane chemotherapy, then maintenance letrozole and palbociclib until October 2019. Due to disease progression, she was placed on exemestane/everolimus.  The patient discontinued everolimus on her own due to severe fatigue and GI side effects.  She continued exemestane until April 2020. Due to progressive disease, she was switched to fulvestrant injections in April 2020.  She also received palliative radiation to the cervical spine.  CT imaging in March revealed fairly stable lymphadenopathy, with possible slight increase in the liver metastasis, however, these lesions were 1 cm or less without an increase in the number of liver metastasis, so overall stable.  The pulmonary nodule and bone metastasis remained stable. CT imaging done in mid July reveals progression of her disease.  Fulvestrant was discontinued.  She is now receiving palliative gemcitabine/vinorelbine every 2 weeks and tolerating this fairly well, except for fatigue and appetite changes. The CA 27.29 decreased somewhat in September.  3. Bone metastases.  We will continue denosumab monthly.  4. B12 deficiency, for which she continues monthly injections.  5. Depression and anxiety, improved with sertraline and lorazepam.  6. Osteopenia, which is stable to improved on recent bone density scan.  The denosumab will also treat her osteopenia.   7. Tobacco  abuse.  She smokes a small amount, but is not motivated to quit completely.  8. Chronic cervical spine pain due to metastasis and fracture.  She discontinued methadone on her own.  As her pain is controlled with hydrocodone/APAP every 4 hours as needed.  9. Worsening elevation of the liver transaminases, but still in the same range as previously.  This was originally felt to be due to Tylenol use.  This may be due to chemotherapy.  She wishes to try going back on methadone to try to decrease her need for the hydrocodone/APAP.  She had been taking methadone 63m, but I recommend starting with 537m  10. Fatigue and hair loss, likely secondary to chemotherapy.   11. Weight loss, she continues to regain weight.  12. Slight wound dehiscence at the previous Port-A-Cath site, with overlying eschar, without evidence of infection.  13. Mild neutropenia and anemia, which have resolved.  14. Hypokalemia, due to holding potassium supplement.  I recommended she receive IV potassium, but she declines, stating she can take her supplement 3 times a day to bring it up.    Plan: I sent in a refill of the methadone [id number] daily. We will plan to repeat a comprehensive metabolic panel on October 13th and if potassium improved and transaminases are stable, she will proceed with a 6th cycle gemcitabine/vinorelbine on October 14th.  We will then plan to see her back in 2 weeks with a CBC, comprehensive metabolic panel, CA 27-11-46d CT chest, abdomen and pelvis to reassess her disease baseline prior to a 7th cycle of gemcitabine/vinorelbine.  The patient understands the plans discussed today and is in agreement with them.  She knows to contact our office if she develops issues requiring immediate clinical assessment.   I provided *** minutes (1:33 PM - 1:33 PM) of face-to-face time during this this encounter and > 50% was spent counseling as documented under my assessment and plan.    ChrDerwood KaplanD [facility] LP[address]RElmira Heights [id number]: [phone] Fax: [phone], LauRita Oharam acting as scribe for ChrDerwood KaplanD  I have reviewed this report as typed by the medical scribe, and it is complete and accurate.

## 2020-09-05 ENCOUNTER — Inpatient Hospital Stay: Payer: Medicare Other

## 2020-09-05 ENCOUNTER — Inpatient Hospital Stay: Payer: Medicare Other | Admitting: Oncology

## 2020-09-09 DIAGNOSIS — K219 Gastro-esophageal reflux disease without esophagitis: Secondary | ICD-10-CM | POA: Diagnosis not present

## 2020-09-09 DIAGNOSIS — I10 Essential (primary) hypertension: Secondary | ICD-10-CM | POA: Diagnosis not present

## 2020-09-09 DIAGNOSIS — K529 Noninfective gastroenteritis and colitis, unspecified: Secondary | ICD-10-CM | POA: Diagnosis not present

## 2020-09-09 DIAGNOSIS — J069 Acute upper respiratory infection, unspecified: Secondary | ICD-10-CM | POA: Diagnosis not present

## 2020-09-09 DIAGNOSIS — E559 Vitamin D deficiency, unspecified: Secondary | ICD-10-CM | POA: Diagnosis not present

## 2020-09-09 DIAGNOSIS — E785 Hyperlipidemia, unspecified: Secondary | ICD-10-CM | POA: Diagnosis not present

## 2020-09-15 ENCOUNTER — Other Ambulatory Visit: Payer: Self-pay | Admitting: Hematology and Oncology

## 2020-09-15 DIAGNOSIS — C7951 Secondary malignant neoplasm of bone: Secondary | ICD-10-CM

## 2020-09-16 ENCOUNTER — Encounter: Payer: Self-pay | Admitting: Hematology and Oncology

## 2020-09-17 ENCOUNTER — Other Ambulatory Visit: Payer: Self-pay

## 2020-09-17 ENCOUNTER — Encounter (HOSPITAL_COMMUNITY): Payer: Self-pay

## 2020-09-17 ENCOUNTER — Emergency Department (HOSPITAL_COMMUNITY): Payer: Medicare Other

## 2020-09-17 ENCOUNTER — Inpatient Hospital Stay (HOSPITAL_COMMUNITY)
Admission: EM | Admit: 2020-09-17 | Discharge: 2020-09-23 | DRG: 871 | Disposition: A | Discharge status: Hospice | Payer: Medicare Other | Attending: Internal Medicine | Admitting: Internal Medicine

## 2020-09-17 DIAGNOSIS — J9621 Acute and chronic respiratory failure with hypoxia: Secondary | ICD-10-CM | POA: Diagnosis present

## 2020-09-17 DIAGNOSIS — J189 Pneumonia, unspecified organism: Secondary | ICD-10-CM | POA: Diagnosis present

## 2020-09-17 DIAGNOSIS — D630 Anemia in neoplastic disease: Secondary | ICD-10-CM | POA: Diagnosis present

## 2020-09-17 DIAGNOSIS — R531 Weakness: Secondary | ICD-10-CM

## 2020-09-17 DIAGNOSIS — R52 Pain, unspecified: Secondary | ICD-10-CM | POA: Diagnosis not present

## 2020-09-17 DIAGNOSIS — R54 Age-related physical debility: Secondary | ICD-10-CM | POA: Diagnosis present

## 2020-09-17 DIAGNOSIS — R0602 Shortness of breath: Secondary | ICD-10-CM | POA: Diagnosis not present

## 2020-09-17 DIAGNOSIS — Z66 Do not resuscitate: Secondary | ICD-10-CM | POA: Diagnosis present

## 2020-09-17 DIAGNOSIS — F17200 Nicotine dependence, unspecified, uncomplicated: Secondary | ICD-10-CM | POA: Diagnosis present

## 2020-09-17 DIAGNOSIS — R627 Adult failure to thrive: Secondary | ICD-10-CM | POA: Diagnosis present

## 2020-09-17 DIAGNOSIS — Z7189 Other specified counseling: Secondary | ICD-10-CM

## 2020-09-17 DIAGNOSIS — C50411 Malignant neoplasm of upper-outer quadrant of right female breast: Secondary | ICD-10-CM | POA: Diagnosis present

## 2020-09-17 DIAGNOSIS — Y95 Nosocomial condition: Secondary | ICD-10-CM | POA: Diagnosis present

## 2020-09-17 DIAGNOSIS — Z515 Encounter for palliative care: Secondary | ICD-10-CM | POA: Diagnosis not present

## 2020-09-17 DIAGNOSIS — C787 Secondary malignant neoplasm of liver and intrahepatic bile duct: Secondary | ICD-10-CM | POA: Diagnosis present

## 2020-09-17 DIAGNOSIS — N179 Acute kidney failure, unspecified: Secondary | ICD-10-CM | POA: Diagnosis not present

## 2020-09-17 DIAGNOSIS — C7951 Secondary malignant neoplasm of bone: Secondary | ICD-10-CM | POA: Diagnosis present

## 2020-09-17 DIAGNOSIS — J918 Pleural effusion in other conditions classified elsewhere: Secondary | ICD-10-CM | POA: Diagnosis present

## 2020-09-17 DIAGNOSIS — L8990 Pressure ulcer of unspecified site, unspecified stage: Secondary | ICD-10-CM | POA: Insufficient documentation

## 2020-09-17 DIAGNOSIS — D72829 Elevated white blood cell count, unspecified: Secondary | ICD-10-CM | POA: Diagnosis present

## 2020-09-17 DIAGNOSIS — R0902 Hypoxemia: Secondary | ICD-10-CM | POA: Diagnosis not present

## 2020-09-17 DIAGNOSIS — G894 Chronic pain syndrome: Secondary | ICD-10-CM | POA: Diagnosis present

## 2020-09-17 DIAGNOSIS — J90 Pleural effusion, not elsewhere classified: Secondary | ICD-10-CM

## 2020-09-17 DIAGNOSIS — I10 Essential (primary) hypertension: Secondary | ICD-10-CM | POA: Diagnosis present

## 2020-09-17 DIAGNOSIS — F32A Depression, unspecified: Secondary | ICD-10-CM | POA: Diagnosis present

## 2020-09-17 DIAGNOSIS — C782 Secondary malignant neoplasm of pleura: Secondary | ICD-10-CM | POA: Diagnosis present

## 2020-09-17 DIAGNOSIS — F419 Anxiety disorder, unspecified: Secondary | ICD-10-CM | POA: Diagnosis present

## 2020-09-17 DIAGNOSIS — Z9151 Personal history of suicidal behavior: Secondary | ICD-10-CM

## 2020-09-17 DIAGNOSIS — E46 Unspecified protein-calorie malnutrition: Secondary | ICD-10-CM | POA: Diagnosis present

## 2020-09-17 DIAGNOSIS — Z6824 Body mass index (BMI) 24.0-24.9, adult: Secondary | ICD-10-CM

## 2020-09-17 DIAGNOSIS — K219 Gastro-esophageal reflux disease without esophagitis: Secondary | ICD-10-CM | POA: Diagnosis present

## 2020-09-17 DIAGNOSIS — R069 Unspecified abnormalities of breathing: Secondary | ICD-10-CM | POA: Diagnosis not present

## 2020-09-17 DIAGNOSIS — E119 Type 2 diabetes mellitus without complications: Secondary | ICD-10-CM | POA: Diagnosis present

## 2020-09-17 DIAGNOSIS — A419 Sepsis, unspecified organism: Principal | ICD-10-CM | POA: Diagnosis present

## 2020-09-17 DIAGNOSIS — Z79891 Long term (current) use of opiate analgesic: Secondary | ICD-10-CM

## 2020-09-17 DIAGNOSIS — J9601 Acute respiratory failure with hypoxia: Secondary | ICD-10-CM | POA: Diagnosis present

## 2020-09-17 DIAGNOSIS — Z20822 Contact with and (suspected) exposure to covid-19: Secondary | ICD-10-CM | POA: Diagnosis not present

## 2020-09-17 DIAGNOSIS — L89151 Pressure ulcer of sacral region, stage 1: Secondary | ICD-10-CM | POA: Diagnosis not present

## 2020-09-17 DIAGNOSIS — J80 Acute respiratory distress syndrome: Secondary | ICD-10-CM | POA: Diagnosis not present

## 2020-09-17 DIAGNOSIS — Z9011 Acquired absence of right breast and nipple: Secondary | ICD-10-CM

## 2020-09-17 DIAGNOSIS — Z79899 Other long term (current) drug therapy: Secondary | ICD-10-CM

## 2020-09-17 DIAGNOSIS — E8770 Fluid overload, unspecified: Secondary | ICD-10-CM | POA: Diagnosis present

## 2020-09-17 HISTORY — DX: Depression, unspecified: F32.A

## 2020-09-17 HISTORY — DX: Malignant (primary) neoplasm, unspecified: C80.1

## 2020-09-17 LAB — CBC WITH DIFFERENTIAL/PLATELET
Abs Immature Granulocytes: 0.32 10*3/uL — ABNORMAL HIGH (ref 0.00–0.07)
Basophils Absolute: 0.1 10*3/uL (ref 0.0–0.1)
Basophils Relative: 0 %
Eosinophils Absolute: 0 10*3/uL (ref 0.0–0.5)
Eosinophils Relative: 0 %
HCT: 42.1 % (ref 36.0–46.0)
Hemoglobin: 13.7 g/dL (ref 12.0–15.0)
Immature Granulocytes: 2 %
Lymphocytes Relative: 5 %
Lymphs Abs: 1 10*3/uL (ref 0.7–4.0)
MCH: 34.7 pg — ABNORMAL HIGH (ref 26.0–34.0)
MCHC: 32.5 g/dL (ref 30.0–36.0)
MCV: 106.6 fL — ABNORMAL HIGH (ref 80.0–100.0)
Monocytes Absolute: 1 10*3/uL (ref 0.1–1.0)
Monocytes Relative: 6 %
Neutro Abs: 16.3 10*3/uL — ABNORMAL HIGH (ref 1.7–7.7)
Neutrophils Relative %: 87 %
Platelets: 153 10*3/uL (ref 150–400)
RBC: 3.95 MIL/uL (ref 3.87–5.11)
RDW: 17.9 % — ABNORMAL HIGH (ref 11.5–15.5)
WBC: 18.7 10*3/uL — ABNORMAL HIGH (ref 4.0–10.5)
nRBC: 0 % (ref 0.0–0.2)

## 2020-09-17 LAB — D-DIMER, QUANTITATIVE: D-Dimer, Quant: 9.1 ug/mL-FEU — ABNORMAL HIGH (ref 0.00–0.50)

## 2020-09-17 LAB — COMPREHENSIVE METABOLIC PANEL
ALT: 39 U/L (ref 0–44)
AST: 183 U/L — ABNORMAL HIGH (ref 15–41)
Albumin: 2.6 g/dL — ABNORMAL LOW (ref 3.5–5.0)
Alkaline Phosphatase: 269 U/L — ABNORMAL HIGH (ref 38–126)
Anion gap: 21 — ABNORMAL HIGH (ref 5–15)
BUN: 18 mg/dL (ref 6–20)
CO2: 17 mmol/L — ABNORMAL LOW (ref 22–32)
Calcium: 9.3 mg/dL (ref 8.9–10.3)
Chloride: 100 mmol/L (ref 98–111)
Creatinine, Ser: 2.07 mg/dL — ABNORMAL HIGH (ref 0.44–1.00)
GFR, Estimated: 28 mL/min — ABNORMAL LOW (ref >60)
Glucose, Bld: 86 mg/dL (ref 70–99)
Potassium: 4.8 mmol/L (ref 3.5–5.1)
Sodium: 138 mmol/L (ref 135–145)
Total Bilirubin: 1.1 mg/dL (ref 0.3–1.2)
Total Protein: 7.2 g/dL (ref 6.5–8.1)

## 2020-09-17 MED ORDER — ALBUTEROL SULFATE HFA 108 (90 BASE) MCG/ACT IN AERS
8.0000 | INHALATION_SPRAY | Freq: Once | RESPIRATORY_TRACT | Status: AC
  Administered 2020-09-18: 01:00:00 8 via RESPIRATORY_TRACT
  Filled 2020-09-17: qty 6.7

## 2020-09-17 MED ORDER — LEVOFLOXACIN IN D5W 750 MG/150ML IV SOLN
750.0000 mg | Freq: Once | INTRAVENOUS | Status: AC
  Administered 2020-09-18: 750 mg via INTRAVENOUS
  Filled 2020-09-17: qty 150

## 2020-09-17 MED ORDER — VANCOMYCIN HCL IN DEXTROSE 1-5 GM/200ML-% IV SOLN
1000.0000 mg | Freq: Once | INTRAVENOUS | Status: AC
  Administered 2020-09-18: 01:00:00 1000 mg via INTRAVENOUS
  Filled 2020-09-17: qty 200

## 2020-09-17 MED ORDER — LORAZEPAM 2 MG/ML IJ SOLN
1.0000 mg | Freq: Once | INTRAMUSCULAR | Status: AC
  Administered 2020-09-17: 23:00:00 1 mg via INTRAVENOUS
  Filled 2020-09-17: qty 1

## 2020-09-17 MED ORDER — METHYLPREDNISOLONE SODIUM SUCC 125 MG IJ SOLR
125.0000 mg | Freq: Once | INTRAMUSCULAR | Status: AC
  Administered 2020-09-17: 23:00:00 125 mg via INTRAVENOUS
  Filled 2020-09-17: qty 2

## 2020-09-17 MED ORDER — MAGNESIUM SULFATE 2 GM/50ML IV SOLN
2.0000 g | Freq: Once | INTRAVENOUS | Status: AC
  Administered 2020-09-17: 23:00:00 2 g via INTRAVENOUS
  Filled 2020-09-17: qty 50

## 2020-09-17 NOTE — Progress Notes (Signed)
A consult was received from an ED physician for vancomycin  per pharmacy dosing.  The patient's profile has been reviewed for ht/wt/allergies/indication/available labs.   A one time order has been placed for vancomycin 1gm    Further antibiotics/pharmacy consults should be ordered by admitting physician if indicated.                       Thank you, Angela Adam 09/17/2020  11:46 PM

## 2020-09-17 NOTE — ED Triage Notes (Signed)
Pt to ED with c/o SOB gradually getting worse over the past 2 weeks. Pt is a breast cancer pt that has declined any further treatment for her cancer. Arrives with a RA sat of 78% on 15lnc. MD is bedside and aware. Allother VSS, pt talking in complete sentences and appears anxious.

## 2020-09-17 NOTE — Progress Notes (Signed)
This RT unable to obtain ABG. RT called other RT to come and try. Charge RN aware

## 2020-09-17 NOTE — ED Provider Notes (Signed)
[facility] DEPT Provider Note   CSN: [id number] Arrival date & time: 09/17/20  2149     History Chief Complaint  Patient presents with  . Shortness of Breath    Shannon Garcia is a 54 y.o. female.  Patient to ED from home where she lives alone for evaluation of shortness of breath that has been progressively worsening over the last one week. No fever. She states she has chest pressure "that feels like anxiety" because of the SOB. No fever, cough. She is not COVID vaccinated. She is a continuous smoker. History of depression and breast CA which she voluntarily stopped treatment for 3 months ago. No use of O2 at home. Presents significantly SOB, hypoxic, however, awake, alert, oriented.   The history is provided by the patient. No language interpreter was used.  Shortness of Breath Associated symptoms: no abdominal pain, no chest pain, no cough, no fever, no headaches and no sore throat        Past Medical History:  Diagnosis Date  . Cancer ([place])   . Depression     Patient Active Problem List   Diagnosis Date Noted  . Anemia, vitamin B12 deficiency 06/26/2020  . Cancer, metastatic to bone ([place]) 06/26/2020  . Malignant neoplasm of upper-outer quadrant of right female breast ([place]) 06/19/2020    History reviewed. No pertinent surgical history.   OB History   No obstetric history on file.     No family history on file.  Social History   Tobacco Use  . Smoking status: Current Every Day Smoker  Substance Use Topics  . Alcohol use: Yes  . Drug use: Never    Home Medications Prior to Admission medications   Medication Sig Start Date End Date Taking? Authorizing Provider  HYDROcodone-acetaminophen (NORCO) 10-325 MG tablet TAKE ONE TABLET BY MOUTH EVERY 4 HOURS AS NEEDED FOR PAIN 08/29/20   Rosanne Sack A, PA-C  methadone (DOLOPHINE) 10 MG tablet TAKE 1/2 TABLET EVERY 12 HOURS 09/16/20   Mosher, Vida Roller A, PA-C    Allergies     Wasp venom and Azithromycin  Review of Systems   Review of Systems  Constitutional: Negative for chills and fever.  HENT: Negative for congestion and sore throat.   Eyes: Negative for visual disturbance.  Respiratory: Positive for chest tightness and shortness of breath. Negative for cough.   Cardiovascular: Negative for chest pain.  Gastrointestinal: Negative for abdominal pain and diarrhea.  Genitourinary: Negative.   Musculoskeletal: Negative for myalgias.  Neurological: Negative for syncope and headaches.  Psychiatric/Behavioral: The patient is nervous/anxious.     Physical Exam Updated Vital Signs BP 133/61 (BP Location: Right Arm)   Pulse (!) 119   Resp (!) 22   SpO2 (!) 78%   Physical Exam Vitals and nursing note reviewed.  Constitutional:      Appearance: She is well-developed and well-nourished.     Comments: Frail patient, chronically ill appearing  HENT:     Head: Normocephalic.  Cardiovascular:     Rate and Rhythm: Regular rhythm. Tachycardia present.     Heart sounds: No murmur heard.   Pulmonary:     Effort: Pulmonary effort is normal.     Breath sounds: Decreased breath sounds (L>R) and rales present. No wheezing.  Chest:     Chest wall: No tenderness.  Abdominal:     General: Bowel sounds are normal.     Palpations: Abdomen is soft.     Tenderness: There is no abdominal tenderness.  There is no guarding or rebound.  Musculoskeletal:        General: Normal range of motion.     Cervical back: Normal range of motion and neck supple.     Right lower leg: No edema.     Left lower leg: No edema.  Skin:    General: Skin is warm and dry.     Findings: No rash.  Neurological:     Mental Status: She is alert and oriented to person, place, and time.  Psychiatric:        Mood and Affect: Mood and affect normal.     ED Results / Procedures / Treatments   Labs (all labs ordered are listed, but only abnormal results are displayed) Labs Reviewed  RESP  PANEL BY RT-PCR (FLU A&B, COVID) ARPGX2  COMPREHENSIVE METABOLIC PANEL  CBC WITH DIFFERENTIAL/PLATELET  D-DIMER, QUANTITATIVE (NOT AT [facility])  BLOOD GAS, ARTERIAL  BRAIN NATRIURETIC PEPTIDE   Results for orders placed or performed during the hospital encounter of 09/17/20  Resp Panel by RT-PCR (Flu A&B, Covid) Nasopharyngeal Swab   Specimen: Nasopharyngeal Swab; Nasopharyngeal(NP) swabs in vial transport medium  Result Value Ref Range   SARS Coronavirus 2 by RT PCR NEGATIVE NEGATIVE   Influenza A by PCR NEGATIVE NEGATIVE   Influenza B by PCR NEGATIVE NEGATIVE  Culture, blood (routine x 2)   Specimen: BLOOD  Result Value Ref Range   Specimen Description      BLOOD LEFT ANTECUBITAL Performed at [facility], [place] [address], [place], [place] [address]    Special Requests      BOTTLES DRAWN AEROBIC ONLY Blood Culture results may not be optimal due to an excessive volume of blood received in culture bottles Performed at [facility] [address], [place], [place] [address]    Culture      NO GROWTH < 12 HOURS Performed at [facility] [address], [place], [place] [address]    Report Status PENDING   Culture, blood (routine x 2)   Specimen: BLOOD  Result Value Ref Range   Specimen Description      BLOOD LEFT WRIST Performed at [facility] [address], [place], [place] [address]    Special Requests      BOTTLES DRAWN AEROBIC ONLY Blood Culture adequate volume Performed at [facility] [address] [place],  [address]    Culture      NO GROWTH < 12 HOURS Performed at [facility] [address], [place], [place] [address]    Report Status PENDING   Comprehensive metabolic panel  Result Value Ref Range   Sodium 138 135 - 145 mmol/L   Potassium 4.8 3.5 - 5.1 mmol/L   Chloride 100 98 - 111 mmol/L   CO2 17 (L) 22 - 32 mmol/L   Glucose, Bld 86 70 - 99 mg/dL   BUN 18 6  - 20 mg/dL   Creatinine, Ser 2.07 (H) 0.44 - 1.00 mg/dL   Calcium 9.3 8.9 - 10.3 mg/dL   Total Protein 7.2 6.5 - 8.1 g/dL   Albumin 2.6 (L) 3.5 - 5.0 g/dL   AST 183 (H) 15 - 41 U/L   ALT 39 0 - 44 U/L   Alkaline Phosphatase 269 (H) 38 - 126 U/L   Total Bilirubin 1.1 0.3 - 1.2 mg/dL   GFR, Estimated 28 (L) >60 mL/min   Anion gap 21 (H) 5 - 15  CBC with Differential  Result Value Ref Range   WBC 18.7 (H) 4.0 - 10.5 K/uL   RBC 3.95 3.87 - 5.11 MIL/uL   Hemoglobin 13.7 12.0 - 15.0 g/dL   HCT 42.1 36.0 - 46.0 %   MCV 106.6 (H) 80.0 - 100.0 fL   MCH 34.7 (H) 26.0 - 34.0 pg   MCHC 32.5 30.0 - 36.0 g/dL   RDW 17.9 (H) 11.5 - 15.5 %   Platelets 153 150 - 400 K/uL   nRBC 0.0 0.0 - 0.2 %   Neutrophils Relative % 87 %   Neutro Abs 16.3 (H) 1.7 - 7.7 K/uL   Lymphocytes Relative 5 %   Lymphs Abs 1.0 0.7 - 4.0 K/uL   Monocytes Relative 6 %   Monocytes Absolute 1.0 0.1 - 1.0 K/uL   Eosinophils Relative 0 %   Eosinophils Absolute 0.0 0.0 - 0.5 K/uL   Basophils Relative 0 %   Basophils Absolute 0.1 0.0 - 0.1 K/uL   Immature Granulocytes 2 %   Abs Immature Granulocytes 0.32 (H) 0.00 - 0.07 K/uL  D-dimer, quantitative  Result Value Ref Range   D-Dimer, Quant 9.10 (H) 0.00 - 0.50 ug/mL-FEU  Brain natriuretic peptide  Result Value Ref Range   B Natriuretic Peptide 393.9 (H) 0.0 - 100.0 pg/mL  Lactic acid, plasma  Result Value Ref Range   Lactic Acid, Venous 7.4 (HH) 0.5 - 1.9 mmol/L  Lactic acid, plasma  Result Value Ref Range   Lactic Acid, Venous 5.8 (HH) 0.5 - 1.9 mmol/L  APTT  Result Value Ref Range   aPTT 39 (H) 24 - 36 seconds  Protime-INR  Result Value Ref Range   Prothrombin Time 18.0 (H) 11.4 - 15.2 seconds   INR 1.5 (H) 0.8 - 1.2  Heparin level (unfractionated)  Result Value Ref Range   Heparin Unfractionated <0.10 (L) 0.30 - 0.70 IU/mL  CBC  Result Value Ref Range   WBC 12.7 (H) 4.0 - 10.5 K/uL   RBC 2.95 (L) 3.87 - 5.11 MIL/uL   Hemoglobin 10.3 (L) 12.0 - 15.0 g/dL    HCT 31.5 (L) 36.0 - 46.0 %   MCV 106.8 (H) 80.0 - 100.0 fL   MCH 34.9 (H) 26.0 - 34.0 pg   MCHC 32.7 30.0 - 36.0 g/dL   RDW 17.6 (H) 11.5 - 15.5 %   Platelets 99 (L) 150 - 400 K/uL   nRBC 0.0 0.0 - 0.2 %  HIV Antibody (routine testing w rflx)  Result Value Ref Range   HIV Screen 4th Generation wRfx Non Reactive Non Reactive  Cortisol-am, blood  Result Value Ref Range   Cortisol - AM 48.0 (H) 6.7 - 22.6 ug/dL  Procalcitonin  Result Value Ref Range   Procalcitonin 14.01 ng/mL  Creatinine, serum  Result Value Ref Range   Creatinine, Ser 1.21 (H) 0.44 - 1.00 mg/dL   GFR, Estimated 53 (L) >60 mL/min  Protime-INR  Result Value Ref Range   Prothrombin Time 21.8 (H) 11.4 - 15.2 seconds   INR 2.0 (H) 0.8 - 1.2  Blood gas, arterial  Result Value Ref Range   FIO2 100.00    pH, Arterial 7.308 (L) 7.350 - 7.450   pCO2 arterial 44.4 32.0 - 48.0 mmHg   pO2, Arterial 58.8 (L) 83.0 - 108.0 mmHg   Bicarbonate 21.6 20.0 - 28.0 mmol/L   Acid-base deficit 4.1 (H) 0.0 - 2.0 mmol/L   O2 Saturation 86.2 %   Patient temperature 98.6   CBC  Result Value Ref Range   WBC 12.1 (H) 4.0 - 10.5 K/uL   RBC 3.57 (L) 3.87 - 5.11 MIL/uL   Hemoglobin 12.4 12.0 - 15.0 g/dL   HCT 37.3 36.0 - 46.0 %   MCV 104.5 (H) 80.0 - 100.0 fL   MCH 34.7 (H) 26.0 - 34.0 pg   MCHC 33.2 30.0 - 36.0 g/dL   RDW 17.7 (H) 11.5 - 15.5 %   Platelets 59 (L) 150 - 400 K/uL   nRBC 0.0 0.0 - 0.2 %     EKG None  Radiology No results found. CT Chest Wo Contrast  Result Date: 09/18/2020 CLINICAL DATA:  Shortness of breath. EXAM: CT CHEST WITHOUT CONTRAST TECHNIQUE: Multidetector CT imaging of the chest was performed following the standard protocol without IV contrast. COMPARISON:  August 03, 2020 FINDINGS: Cardiovascular: There is mild calcification of the aortic arch. Normal heart size with mild to moderate severity coronary artery calcification. No pericardial effusion. Mediastinum/Nodes: No enlarged mediastinal or  axillary lymph nodes, however, this is limited in evaluation in the absence of intravenous contrast. Thyroid gland, trachea, and esophagus demonstrate no significant findings. Lungs/Pleura: Marked severity multifocal infiltrates are seen throughout both lungs. Moderate to marked severity consolidation is seen within the right lower lobe. There is a large right-sided pleural effusion. No pneumothorax is identified. Upper Abdomen: No acute abnormality. Musculoskeletal: There is evidence of prior right-sided mastectomy. Multiple stable mixed density lesions are again seen within the thoracic spine. IMPRESSION: 1. Marked severity bilateral multifocal infiltrates with moderate to marked severity right lower lobe consolidation. 2. Large right-sided pleural effusion. 3. Findings consistent with osseous metastasis within the thoracic spine. 4. Evidence of prior right-sided mastectomy. 5. Aortic atherosclerosis. Aortic Atherosclerosis (ICD10-I70.0). Electronically Signed   By: Virgina Norfolk M.D.   On: 09/18/2020 03:48   DG Chest Port 1 View  Result Date: 09/18/2020 CLINICAL DATA:  Status post right thoracentesis. EXAM: PORTABLE CHEST 1 VIEW COMPARISON:  Chest radiograph from one day prior. FINDINGS: Stable left subclavian Port-A-Cath terminating in the middle third of the SVC. Stable cardiomediastinal silhouette with mild cardiomegaly. No pneumothorax. Small right pleural effusion, significantly decreased. Stable small left pleural effusion. Improved aeration at the right lung base. Persistent diffuse patchy hazy lung opacities, left greater than right. IMPRESSION: 1. No pneumothorax. Small right pleural effusion, significantly decreased. 2. Stable small left pleural effusion. 3. Improved aeration at the right lung base. 4. Persistent diffuse patchy hazy lung opacities, left greater than right, favoring multilobar pneumonia. Electronically Signed   By: Ilona Sorrel M.D.   On: 09/18/2020 16:04   DG Chest Port 1  View  Result Date: 09/17/2020 CLINICAL DATA:  Worsening shortness of breath for 2 weeks EXAM: PORTABLE CHEST 1 VIEW COMPARISON:  CT 08/03/2020, radiograph 05/09/2020 FINDINGS: Interval development of a large right pleural effusion which tracks laterally and over the right lung apex. Likely passive atelectatic change within the regions of adjacent opacity. Some additional perihilar hazy opacity in the right lung and diffusely throughout the left lung is noted with vascular congestion. A left subclavian approach Port-A-Cath is seen in the soft tissues of the left chest wall with the tip positioned near the superior cavoatrial junction. Right heart border largely obscured by adjacent opacity. Visible cardiomediastinal contours are unremarkable. No acute osseous abnormality. Some edematous changes in the soft tissues. Degenerative changes are present in the imaged spine and shoulders. Prior right mastectomy. Telemetry leads overlie the chest. IMPRESSION: 1. Interval development of a large right pleural effusion  which tracks laterally and over the right lung apex with adjacent areas of passive atelectatic change 2. Some additional perihilar hazy opacity in the right lung and diffusely throughout the left lung, likely reflect edema though underlying infection is difficult to exclude. Electronically Signed   By: Lovena Le M.D.   On: 09/17/2020 23:09   US THORACENTESIS ASP PLEURAL SPACE W/IMG GUIDE  Result Date: 09/18/2020 INDICATION: History of metastatic breast cancer. Shortness of breath. Large right pleural effusion. Request therapeutic thoracentesis. EXAM: ULTRASOUND GUIDED RIGHT THORACENTESIS MEDICATIONS: 1% plain lidocaine, 5 mL COMPLICATIONS: None immediate. PROCEDURE: An ultrasound guided thoracentesis was thoroughly discussed with the patient and questions answered. The benefits, risks, alternatives and complications were also discussed. The patient understands and wishes to proceed with the procedure.  Written consent was obtained. Ultrasound was performed to localize and mark an adequate pocket of fluid in the right chest. The area was then prepped and draped in the normal sterile fashion. 1% Lidocaine was used for local anesthesia. Under ultrasound guidance a 6 Fr Safe-T-Centesis catheter was introduced. Thoracentesis was performed. The catheter was removed and a dressing applied. FINDINGS: A total of approximately 2 L of slightly hazy, amber colored fluid was removed. IMPRESSION: Successful ultrasound guided right thoracentesis yielding 2 L of pleural fluid. Read by: Ascencion Dike PA-C Electronically Signed   By: Aletta Edouard M.D.   On: 09/18/2020 15:41    Procedures Procedures (including critical care time) CRITICAL CARE Performed by: Dewaine Oats   Total critical care time: 50 minutes  Critical care time was exclusive of separately billable procedures and treating other patients.  Critical care was necessary to treat or prevent imminent or life-threatening deterioration.  Critical care was time spent personally by me on the following activities: development of treatment plan with patient and/or surrogate as well as nursing, discussions with consultants, evaluation of patient's response to treatment, examination of patient, obtaining history from patient or surrogate, ordering and performing treatments and interventions, ordering and review of laboratory studies, ordering and review of radiographic studies, pulse oximetry and re-evaluation of patient's condition.  Medications Ordered in ED Medications - No data to display  ED Course  I have reviewed the triage vital signs and the nursing notes.  Pertinent labs & imaging results that were available during my care of the patient were reviewed by me and considered in my medical decision making (see chart for details).  Clinical Course as of 09/17/20 2312  Sat Sep 17, 2020  2310 She is currently alert and lucid with oxygen  saturation 92% on 15 L facemask.  No respiratory distress at this time.  She is texting on her telephone. [EW]    Clinical Course User Index [EW] Daleen Bo, MD   MDM Rules/Calculators/A&P                          Patient to ED with SoB, increasing over 1 week. Smoker, not on home O2. History of Breast CA, voluntarily ending treatment 3 months ago.   The patient arrives to ED with saturation of 78% on 4 L, increases to 92% on NRB at 15L. Even at 78% the patient is awake, mild difficulty speaking full sentences, oriented. Dr. Eulis Foster has been involved in patient care. Do not feel intubation is indicated at this time.   CXR, labs ordered. POC COVID ordered to facilitate nebulized treatments - pending. She is anxious, restless. Ativan provided. Solumedrol ordered.   CXR showing large  right pleural effusion. Leukocytosis of 19. On re-evaluation, the patient's O2 saturation remains mid-80's on NRB. She is awake, responsive, oriented.   CTchest ordered. D-dimer significantly elevated >9. History of CA, no history of PE. COVID, viral panel negative. ABG unable to be obtained, VBG pending. O2 saturations remain stable without improvement or worsening.   Discussed admission with dr. Jonelle Sidle who accepts the patient for admission.   Final Clinical Impression(s) / ED Diagnoses Final diagnoses:  None    Rx / DC Orders ED Discharge Orders    None       Dennie Bible 09/19/20 1638    Daleen Bo, MD 09/19/20 [phone]

## 2020-09-17 NOTE — ED Provider Notes (Signed)
  Face-to-face evaluation   History: She presents for evaluation of shortness of breath for several weeks, without improvement using usual medications. She continues to smoke. EMS found her saturation levels low, 78 on room air and placed her on 10 L facemask. They gave her a DuoNeb during transport but no Solu-Medrol because she states it makes her anxious.  Physical exam: Elderly female, alert, cooperative. She does not appear to be in respiratory distress. She has mild tachypnea. Oxygen saturation on arrival while on oxygen is low.  Medical screening examination/treatment/procedure(s) were conducted as a shared visit with non-physician practitioner(s) and myself.  I personally evaluated the patient during the encounter    Daleen Bo, MD 09/19/20 [phone]

## 2020-09-17 NOTE — Progress Notes (Addendum)
This writer attempted to obtain abg but was unsuccessful.  MD notified and will order vbg instead.  RN aware.

## 2020-09-18 ENCOUNTER — Inpatient Hospital Stay (HOSPITAL_COMMUNITY): Payer: Medicare Other

## 2020-09-18 ENCOUNTER — Emergency Department (HOSPITAL_COMMUNITY): Payer: Medicare Other

## 2020-09-18 DIAGNOSIS — Z6824 Body mass index (BMI) 24.0-24.9, adult: Secondary | ICD-10-CM | POA: Diagnosis not present

## 2020-09-18 DIAGNOSIS — Z79899 Other long term (current) drug therapy: Secondary | ICD-10-CM | POA: Diagnosis not present

## 2020-09-18 DIAGNOSIS — E46 Unspecified protein-calorie malnutrition: Secondary | ICD-10-CM | POA: Diagnosis present

## 2020-09-18 DIAGNOSIS — D630 Anemia in neoplastic disease: Secondary | ICD-10-CM | POA: Diagnosis present

## 2020-09-18 DIAGNOSIS — Z9011 Acquired absence of right breast and nipple: Secondary | ICD-10-CM | POA: Diagnosis not present

## 2020-09-18 DIAGNOSIS — K219 Gastro-esophageal reflux disease without esophagitis: Secondary | ICD-10-CM | POA: Diagnosis present

## 2020-09-18 DIAGNOSIS — G2581 Restless legs syndrome: Secondary | ICD-10-CM | POA: Diagnosis not present

## 2020-09-18 DIAGNOSIS — Z79891 Long term (current) use of opiate analgesic: Secondary | ICD-10-CM | POA: Diagnosis not present

## 2020-09-18 DIAGNOSIS — Z9151 Personal history of suicidal behavior: Secondary | ICD-10-CM | POA: Diagnosis not present

## 2020-09-18 DIAGNOSIS — Y95 Nosocomial condition: Secondary | ICD-10-CM | POA: Diagnosis present

## 2020-09-18 DIAGNOSIS — Z7401 Bed confinement status: Secondary | ICD-10-CM | POA: Diagnosis not present

## 2020-09-18 DIAGNOSIS — J90 Pleural effusion, not elsewhere classified: Secondary | ICD-10-CM | POA: Diagnosis not present

## 2020-09-18 DIAGNOSIS — E43 Unspecified severe protein-calorie malnutrition: Secondary | ICD-10-CM | POA: Diagnosis not present

## 2020-09-18 DIAGNOSIS — R5381 Other malaise: Secondary | ICD-10-CM | POA: Diagnosis not present

## 2020-09-18 DIAGNOSIS — J9621 Acute and chronic respiratory failure with hypoxia: Secondary | ICD-10-CM | POA: Diagnosis present

## 2020-09-18 DIAGNOSIS — J189 Pneumonia, unspecified organism: Secondary | ICD-10-CM | POA: Diagnosis present

## 2020-09-18 DIAGNOSIS — R404 Transient alteration of awareness: Secondary | ICD-10-CM | POA: Diagnosis not present

## 2020-09-18 DIAGNOSIS — J9601 Acute respiratory failure with hypoxia: Secondary | ICD-10-CM | POA: Diagnosis present

## 2020-09-18 DIAGNOSIS — C787 Secondary malignant neoplasm of liver and intrahepatic bile duct: Secondary | ICD-10-CM | POA: Diagnosis present

## 2020-09-18 DIAGNOSIS — J910 Malignant pleural effusion: Secondary | ICD-10-CM | POA: Diagnosis not present

## 2020-09-18 DIAGNOSIS — Z66 Do not resuscitate: Secondary | ICD-10-CM | POA: Diagnosis present

## 2020-09-18 DIAGNOSIS — C782 Secondary malignant neoplasm of pleura: Secondary | ICD-10-CM | POA: Diagnosis present

## 2020-09-18 DIAGNOSIS — C7951 Secondary malignant neoplasm of bone: Secondary | ICD-10-CM | POA: Diagnosis present

## 2020-09-18 DIAGNOSIS — M2550 Pain in unspecified joint: Secondary | ICD-10-CM | POA: Diagnosis not present

## 2020-09-18 DIAGNOSIS — Z20822 Contact with and (suspected) exposure to covid-19: Secondary | ICD-10-CM | POA: Diagnosis present

## 2020-09-18 DIAGNOSIS — I10 Essential (primary) hypertension: Secondary | ICD-10-CM | POA: Diagnosis not present

## 2020-09-18 DIAGNOSIS — E119 Type 2 diabetes mellitus without complications: Secondary | ICD-10-CM | POA: Diagnosis present

## 2020-09-18 DIAGNOSIS — J9690 Respiratory failure, unspecified, unspecified whether with hypoxia or hypercapnia: Secondary | ICD-10-CM | POA: Diagnosis not present

## 2020-09-18 DIAGNOSIS — Z7189 Other specified counseling: Secondary | ICD-10-CM | POA: Diagnosis not present

## 2020-09-18 DIAGNOSIS — R0902 Hypoxemia: Secondary | ICD-10-CM | POA: Diagnosis not present

## 2020-09-18 DIAGNOSIS — J918 Pleural effusion in other conditions classified elsewhere: Secondary | ICD-10-CM | POA: Diagnosis present

## 2020-09-18 DIAGNOSIS — F17200 Nicotine dependence, unspecified, uncomplicated: Secondary | ICD-10-CM | POA: Diagnosis present

## 2020-09-18 DIAGNOSIS — R6520 Severe sepsis without septic shock: Secondary | ICD-10-CM | POA: Diagnosis not present

## 2020-09-18 DIAGNOSIS — F32A Depression, unspecified: Secondary | ICD-10-CM | POA: Diagnosis present

## 2020-09-18 DIAGNOSIS — Z853 Personal history of malignant neoplasm of breast: Secondary | ICD-10-CM | POA: Diagnosis not present

## 2020-09-18 DIAGNOSIS — Z9981 Dependence on supplemental oxygen: Secondary | ICD-10-CM | POA: Diagnosis not present

## 2020-09-18 DIAGNOSIS — Z170 Estrogen receptor positive status [ER+]: Secondary | ICD-10-CM | POA: Diagnosis not present

## 2020-09-18 DIAGNOSIS — D72829 Elevated white blood cell count, unspecified: Secondary | ICD-10-CM | POA: Diagnosis not present

## 2020-09-18 DIAGNOSIS — N179 Acute kidney failure, unspecified: Secondary | ICD-10-CM | POA: Diagnosis not present

## 2020-09-18 DIAGNOSIS — C50411 Malignant neoplasm of upper-outer quadrant of right female breast: Secondary | ICD-10-CM | POA: Diagnosis present

## 2020-09-18 DIAGNOSIS — A419 Sepsis, unspecified organism: Secondary | ICD-10-CM | POA: Diagnosis present

## 2020-09-18 DIAGNOSIS — R531 Weakness: Secondary | ICD-10-CM | POA: Diagnosis not present

## 2020-09-18 DIAGNOSIS — Z515 Encounter for palliative care: Secondary | ICD-10-CM | POA: Diagnosis not present

## 2020-09-18 DIAGNOSIS — G894 Chronic pain syndrome: Secondary | ICD-10-CM | POA: Diagnosis present

## 2020-09-18 DIAGNOSIS — F419 Anxiety disorder, unspecified: Secondary | ICD-10-CM | POA: Diagnosis present

## 2020-09-18 LAB — CREATININE, SERUM
Creatinine, Ser: 1.21 mg/dL — ABNORMAL HIGH (ref 0.44–1.00)
GFR, Estimated: 53 mL/min — ABNORMAL LOW (ref >60)

## 2020-09-18 LAB — CBC
HCT: 31.5 % — ABNORMAL LOW (ref 36.0–46.0)
Hemoglobin: 10.3 g/dL — ABNORMAL LOW (ref 12.0–15.0)
MCH: 34.9 pg — ABNORMAL HIGH (ref 26.0–34.0)
MCHC: 32.7 g/dL (ref 30.0–36.0)
MCV: 106.8 fL — ABNORMAL HIGH (ref 80.0–100.0)
Platelets: 99 10*3/uL — ABNORMAL LOW (ref 150–400)
RBC: 2.95 MIL/uL — ABNORMAL LOW (ref 3.87–5.11)
RDW: 17.6 % — ABNORMAL HIGH (ref 11.5–15.5)
WBC: 12.7 10*3/uL — ABNORMAL HIGH (ref 4.0–10.5)
nRBC: 0 % (ref 0.0–0.2)

## 2020-09-18 LAB — HEPARIN LEVEL (UNFRACTIONATED): Heparin Unfractionated: <0.1 IU/mL — ABNORMAL LOW (ref 0.30–0.70)

## 2020-09-18 LAB — LACTIC ACID, PLASMA
Lactic Acid, Venous: 7.4 mmol/L (ref 0.5–1.9)
Lactic Acid, Venous: 5.8 mmol/L (ref 0.5–1.9)

## 2020-09-18 LAB — BRAIN NATRIURETIC PEPTIDE: B Natriuretic Peptide: 393.9 pg/mL — ABNORMAL HIGH (ref 0.0–100.0)

## 2020-09-18 LAB — PROTIME-INR
INR: 1.5 — ABNORMAL HIGH (ref 0.8–1.2)
INR: 2 — ABNORMAL HIGH (ref 0.8–1.2)
Prothrombin Time: 18 seconds — ABNORMAL HIGH (ref 11.4–15.2)
Prothrombin Time: 21.8 seconds — ABNORMAL HIGH (ref 11.4–15.2)

## 2020-09-18 LAB — RESP PANEL BY RT-PCR (FLU A&B, COVID) ARPGX2
Influenza A by PCR: NEGATIVE
Influenza B by PCR: NEGATIVE
SARS Coronavirus 2 by RT PCR: NEGATIVE

## 2020-09-18 LAB — BLOOD GAS, ARTERIAL
Acid-base deficit: 4.1 mmol/L — ABNORMAL HIGH (ref 0.0–2.0)
Bicarbonate: 21.6 mmol/L (ref 20.0–28.0)
FIO2: 100
O2 Saturation: 86.2 %
Patient temperature: 98.6
pCO2 arterial: 44.4 mmHg (ref 32.0–48.0)
pH, Arterial: 7.308 — ABNORMAL LOW (ref 7.350–7.450)
pO2, Arterial: 58.8 mmHg — ABNORMAL LOW (ref 83.0–108.0)

## 2020-09-18 LAB — CORTISOL-AM, BLOOD: Cortisol - AM: 48 ug/dL — ABNORMAL HIGH (ref 6.7–22.6)

## 2020-09-18 LAB — PROCALCITONIN: Procalcitonin: 14.01 ng/mL

## 2020-09-18 LAB — HIV ANTIBODY (ROUTINE TESTING W REFLEX): HIV Screen 4th Generation wRfx: NONREACTIVE

## 2020-09-18 LAB — APTT: aPTT: 39 seconds — ABNORMAL HIGH (ref 24–36)

## 2020-09-18 MED ORDER — HYDROXYZINE HCL 50 MG/ML IM SOLN
50.0000 mg | Freq: Once | INTRAMUSCULAR | Status: AC
  Administered 2020-09-18: 04:00:00 50 mg via INTRAMUSCULAR
  Filled 2020-09-18: qty 1

## 2020-09-18 MED ORDER — DEXTROSE-NACL 5-0.9 % IV SOLN: INTRAVENOUS | Status: AC

## 2020-09-18 MED ORDER — ACETAMINOPHEN 650 MG RE SUPP: 650.0000 mg | Freq: Four times a day (QID) | RECTAL | Status: DC | PRN

## 2020-09-18 MED ORDER — VANCOMYCIN HCL IN DEXTROSE 1-5 GM/200ML-% IV SOLN
1000.0000 mg | INTRAVENOUS | Status: DC
  Administered 2020-09-19: 01:00:00 1000 mg via INTRAVENOUS
  Filled 2020-09-18: qty 200

## 2020-09-18 MED ORDER — HEPARIN (PORCINE) 25000 UT/250ML-% IV SOLN
1150.0000 [IU]/h | INTRAVENOUS | Status: DC
  Administered 2020-09-18: 01:00:00 1150 [IU]/h via INTRAVENOUS
  Filled 2020-09-18: qty 250

## 2020-09-18 MED ORDER — ACETAMINOPHEN 325 MG PO TABS
650.0000 mg | ORAL_TABLET | Freq: Four times a day (QID) | ORAL | Status: DC | PRN
  Administered 2020-09-22: 650 mg via ORAL
  Filled 2020-09-18: qty 2

## 2020-09-18 MED ORDER — HEPARIN SODIUM (PORCINE) 5000 UNIT/ML IJ SOLN: 5000.0000 [IU] | Freq: Three times a day (TID) | INTRAMUSCULAR | Status: DC

## 2020-09-18 MED ORDER — SODIUM CHLORIDE 0.9 % IV BOLUS
500.0000 mL | Freq: Once | INTRAVENOUS | Status: AC
  Administered 2020-09-18: 04:00:00 500 mL via INTRAVENOUS

## 2020-09-18 MED ORDER — SODIUM CHLORIDE 0.9 % IV SOLN: 2.0000 g | Freq: Once | INTRAVENOUS | Status: DC

## 2020-09-18 MED ORDER — SODIUM CHLORIDE 0.9% FLUSH: 10.0000 mL | INTRAVENOUS | Status: DC | PRN

## 2020-09-18 MED ORDER — HEPARIN BOLUS VIA INFUSION
3000.0000 [IU] | Freq: Once | INTRAVENOUS | Status: AC
  Administered 2020-09-18: 01:00:00 3000 [IU] via INTRAVENOUS
  Filled 2020-09-18: qty 3000

## 2020-09-18 MED ORDER — SODIUM CHLORIDE 0.9 % IV SOLN
2.0000 g | Freq: Two times a day (BID) | INTRAVENOUS | Status: DC
  Administered 2020-09-18 – 2020-09-19 (×3): 2 g via INTRAVENOUS
  Filled 2020-09-18 (×3): qty 2

## 2020-09-18 MED ORDER — ONDANSETRON HCL 4 MG/2ML IJ SOLN: 4.0000 mg | Freq: Four times a day (QID) | INTRAMUSCULAR | Status: DC | PRN

## 2020-09-18 MED ORDER — ONDANSETRON HCL 4 MG PO TABS: 4.0000 mg | ORAL_TABLET | Freq: Four times a day (QID) | ORAL | Status: DC | PRN

## 2020-09-18 MED ORDER — VANCOMYCIN HCL IN DEXTROSE 1-5 GM/200ML-% IV SOLN: 1000.0000 mg | Freq: Once | INTRAVENOUS | Status: DC

## 2020-09-18 MED ORDER — LORAZEPAM 2 MG/ML IJ SOLN
1.0000 mg | INTRAMUSCULAR | Status: DC | PRN
  Administered 2020-09-18 (×2): 1 mg via INTRAVENOUS
  Filled 2020-09-18 (×2): qty 1

## 2020-09-18 MED ORDER — ORAL CARE MOUTH RINSE
15.0000 mL | Freq: Two times a day (BID) | OROMUCOSAL | Status: DC
  Administered 2020-09-19 – 2020-09-23 (×9): 15 mL via OROMUCOSAL

## 2020-09-18 MED ORDER — CHLORHEXIDINE GLUCONATE CLOTH 2 % EX PADS
6.0000 | MEDICATED_PAD | Freq: Every day | CUTANEOUS | Status: DC
  Administered 2020-09-18 – 2020-09-23 (×5): 6 via TOPICAL

## 2020-09-18 MED ORDER — LIDOCAINE HCL 1 % IJ SOLN
INTRAMUSCULAR | Status: AC
  Filled 2020-09-18: qty 20

## 2020-09-18 MED ORDER — ALTEPLASE 2 MG IJ SOLR
2.0000 mg | Freq: Once | INTRAMUSCULAR | Status: AC
  Administered 2020-09-18: 06:00:00 2 mg
  Filled 2020-09-18: qty 2

## 2020-09-18 NOTE — ED Notes (Signed)
Attempted to collect bloo

## 2020-09-18 NOTE — ED Notes (Signed)
Date and time results received: 09/18/20 0314 (use smartphrase ".now" to insert current time)  Test:Lactic Acid Critical Value: 5.8  Name of Provider Notified:  Orders Received? Or Actions Taken?:waiting on orders

## 2020-09-18 NOTE — ED Notes (Signed)
ED TO INPATIENT HANDOFF REPORT  ED Nurse Name and Phone #: Fredonia Highland [phone]  S Name/Age/Gender Shannon Garcia 54 y.o. female Room/Bed: WA15/WA15  Code Status   Code Status: Full Code  Home/SNF/Other Home Patient oriented to: self and place Is this baseline? No   Triage Complete: Triage complete  Chief Complaint Acute on chronic respiratory failure with hypoxemia (HCC) [J96.21] Acute respiratory failure with hypoxia ([place]) [J96.01]  Triage Note Pt to ED with c/o SOB gradually getting worse over the past 2 weeks. Pt is a breast cancer pt that has declined any further treatment for her cancer. Arrives with a RA sat of 78% on 15lnc. MD is bedside and aware. Allother VSS, pt talking in complete sentences and appears anxious.    Allergies Allergies  Allergen Reactions  . Wasp Venom Other (See Comments)    Unknown  . Azithromycin Rash    Level of Care/Admitting Diagnosis ED Disposition    ED Disposition Condition [facility] Area: [place] [[id number]]  Level of Care: Stepdown [14]  Admit to SDU based on following criteria: Respiratory Distress:  Frequent assessment and/or intervention to maintain adequate ventilation/respiration, pulmonary toilet, and respiratory treatment.  May admit patient to [facility] or [facility] if equivalent level of care is available:: No  Covid Evaluation: Asymptomatic Screening Protocol (No Symptoms)  Diagnosis: Acute respiratory failure with hypoxia [facility]) [[id number]]  Admitting Physician: Elwyn Reach [[id number]]  Attending Physician: Elwyn Reach [[id number]]  Estimated length of stay: past midnight tomorrow  Certification:: I certify this patient will need inpatient services for at least 2 midnights       B Medical/Surgery History Past Medical History:  Diagnosis Date  . Cancer ([place])   . Depression    History reviewed. No pertinent surgical history.   A IV Location/Drains/Wounds Patient  Lines/Drains/Airways Status    Active Line/Drains/Airways    Name Placement date Placement time Site Days   Implanted Port Left Chest --  --  Chest  --   Peripheral IV 09/18/20 Left;Posterior Hand 09/18/20  0040  Hand  less than 1          Intake/Output Last 24 hours  Intake/Output Summary (Last 24 hours) at 09/18/2020 2051 Last data filed at 09/18/2020 2013 Gross per 24 hour  Intake 1708.48 ml  Output --  Net 1708.48 ml    Labs/Imaging Results for orders placed or performed during the hospital encounter of 09/17/20 (from the past 48 hour(s))  Lactic acid, plasma     Status: Abnormal   Collection Time: 09/17/20 12:17 AM  Result Value Ref Range   Lactic Acid, Venous 7.4 (HH) 0.5 - 1.9 mmol/L    Comment: CRITICAL RESULT CALLED TO, READ BACK BY AND VERIFIED WITH: JEANINE Deziree Mokry RH 09/18/2020 @0123  BY P.HENDERSON Performed at [facility], [place] [address] [place], [place] [address]   Resp Panel by RT-PCR (Flu A&B, Covid) Nasopharyngeal Swab     Status: None   Collection Time: 09/17/20 10:40 PM   Specimen: Nasopharyngeal Swab; Nasopharyngeal(NP) swabs in vial transport medium  Result Value Ref Range   SARS Coronavirus 2 by RT PCR NEGATIVE NEGATIVE    Comment: (NOTE) SARS-CoV-2 target nucleic acids are NOT DETECTED.  The SARS-CoV-2 RNA is generally detectable in upper respiratory specimens during the acute phase of infection. The lowest concentration of SARS-CoV-2 viral copies this assay can detect is 138 copies/mL. A negative result does not preclude SARS-Cov-2 infection and should  not be used as the sole basis for treatment or other patient management decisions. A negative result may occur with  improper specimen collection/handling, submission of specimen other than nasopharyngeal swab, presence of viral mutation(s) within the areas targeted by this assay, and inadequate number of viral copies(<138 copies/mL). A negative result must be combined  with clinical observations, patient history, and epidemiological information. The expected result is Negative.  Fact Sheet for Patients:  EntrepreneurPulse.com.au  Fact Sheet for Healthcare Providers:  IncredibleEmployment.be  This test is no t yet approved or cleared by the [place] FDA and  has been authorized for detection and/or diagnosis of SARS-CoV-2 by FDA under an Emergency Use Authorization (EUA). This EUA will remain  in effect (meaning this test can be used) for the duration of the COVID-19 declaration under Section 564(b)(1) of the Act, 21 U.S.C.section 360bbb-3(b)(1), unless the authorization is terminated  or revoked sooner.       Influenza A by PCR NEGATIVE NEGATIVE   Influenza B by PCR NEGATIVE NEGATIVE    Comment: (NOTE) The Xpert Xpress SARS-CoV-2/FLU/RSV plus assay is intended as an aid in the diagnosis of influenza from Nasopharyngeal swab specimens and should not be used as a sole basis for treatment. Nasal washings and aspirates are unacceptable for Xpert Xpress SARS-CoV-2/FLU/RSV testing.  Fact Sheet for Patients: EntrepreneurPulse.com.au  Fact Sheet for Healthcare Providers: IncredibleEmployment.be  This test is not yet approved or cleared by the [place] FDA and has been authorized for detection and/or diagnosis of SARS-CoV-2 by FDA under an Emergency Use Authorization (EUA). This EUA will remain in effect (meaning this test can be used) for the duration of the COVID-19 declaration under Section 564(b)(1) of the Act, 21 U.S.C. section 360bbb-3(b)(1), unless the authorization is terminated or revoked.  Performed at [facility], [place] [address], [place], [place] [address]   Brain natriuretic peptide     Status: Abnormal   Collection Time: 09/17/20 10:47 PM  Result Value Ref Range   B Natriuretic Peptide 393.9 (H) 0.0 - 100.0 pg/mL    Comment:  Performed at [facility], [place] [address], [place], [place] [address]  Comprehensive metabolic panel     Status: Abnormal   Collection Time: 09/17/20 11:00 PM  Result Value Ref Range   Sodium 138 135 - 145 mmol/L   Potassium 4.8 3.5 - 5.1 mmol/L   Chloride 100 98 - 111 mmol/L   CO2 17 (L) 22 - 32 mmol/L   Glucose, Bld 86 70 - 99 mg/dL    Comment: Glucose reference range applies only to samples taken after fasting for at least 8 hours.   BUN 18 6 - 20 mg/dL   Creatinine, Ser 2.07 (H) 0.44 - 1.00 mg/dL   Calcium 9.3 8.9 - 10.3 mg/dL   Total Protein 7.2 6.5 - 8.1 g/dL   Albumin 2.6 (L) 3.5 - 5.0 g/dL   AST 183 (H) 15 - 41 U/L   ALT 39 0 - 44 U/L   Alkaline Phosphatase 269 (H) 38 - 126 U/L   Total Bilirubin 1.1 0.3 - 1.2 mg/dL   GFR, Estimated 28 (L) >60 mL/min    Comment: (NOTE) Calculated using the CKD-EPI Creatinine Equation (2021)    Anion gap 21 (H) 5 - 15    Comment: Performed at [facility], [place] [address] [place], [place] [address]  CBC with Differential     Status: Abnormal   Collection Time: 09/17/20 11:00 PM  Result Value  Ref Range   WBC 18.7 (H) 4.0 - 10.5 K/uL   RBC 3.95 3.87 - 5.11 MIL/uL   Hemoglobin 13.7 12.0 - 15.0 g/dL   HCT 42.1 36.0 - 46.0 %   MCV 106.6 (H) 80.0 - 100.0 fL   MCH 34.7 (H) 26.0 - 34.0 pg   MCHC 32.5 30.0 - 36.0 g/dL   RDW 17.9 (H) 11.5 - 15.5 %   Platelets 153 150 - 400 K/uL   nRBC 0.0 0.0 - 0.2 %   Neutrophils Relative % 87 %   Neutro Abs 16.3 (H) 1.7 - 7.7 K/uL   Lymphocytes Relative 5 %   Lymphs Abs 1.0 0.7 - 4.0 K/uL   Monocytes Relative 6 %   Monocytes Absolute 1.0 0.1 - 1.0 K/uL   Eosinophils Relative 0 %   Eosinophils Absolute 0.0 0.0 - 0.5 K/uL   Basophils Relative 0 %   Basophils Absolute 0.1 0.0 - 0.1 K/uL   Immature Granulocytes 2 %   Abs Immature Granulocytes 0.32 (H) 0.00 - 0.07 K/uL    Comment: Performed at [facility], [place] [address], [place], [place] [address]   D-dimer, quantitative     Status: Abnormal   Collection Time: 09/17/20 11:00 PM  Result Value Ref Range   D-Dimer, Quant 9.10 (H) 0.00 - 0.50 ug/mL-FEU    Comment: (NOTE) At the manufacturer cut-off value of 0.5 g/mL FEU, this assay has a negative predictive value of 95-100%.This assay is intended for use in conjunction with a clinical pretest probability (PTP) assessment model to exclude pulmonary embolism (PE) and deep venous thrombosis (DVT) in outpatients suspected of PE or DVT. Results should be correlated with clinical presentation. Performed at [facility], [place] [address] [place], [place] [address]   APTT     Status: Abnormal   Collection Time: 09/18/20 12:30 AM  Result Value Ref Range   aPTT 39 (H) 24 - 36 seconds    Comment:        IF BASELINE aPTT IS ELEVATED, SUGGEST PATIENT RISK ASSESSMENT BE USED TO DETERMINE APPROPRIATE ANTICOAGULANT THERAPY. Performed at [facility], [place] [address], [place], [place] [address]   Protime-INR     Status: Abnormal   Collection Time: 09/18/20 12:30 AM  Result Value Ref Range   Prothrombin Time 18.0 (H) 11.4 - 15.2 seconds   INR 1.5 (H) 0.8 - 1.2    Comment: (NOTE) INR goal varies based on device and disease states. Performed at [facility], [place] [address], [place], [place] [address]   Lactic acid, plasma     Status: Abnormal   Collection Time: 09/18/20  2:21 AM  Result Value Ref Range   Lactic Acid, Venous 5.8 (HH) 0.5 - 1.9 mmol/L    Comment: CRITICAL RESULT CALLED TO, READ BACK BY AND VERIFIED WITH: J. Lawrencia Mauney RN 09/18/20 @ 0315 BY P.HENDERSON Performed at [facility], [place] [address], [place], [place] [address]   Culture, blood (routine x 2)     Status: None (Preliminary result)   Collection Time: 09/18/20  6:18 AM   Specimen: BLOOD  Result Value Ref Range   Specimen Description      BLOOD LEFT ANTECUBITAL Performed at [facility], [place] [address], [place], [place] [address]    Special Requests      BOTTLES DRAWN AEROBIC ONLY Blood Culture results may not be optimal due to an excessive volume of blood received in culture bottles  Performed at [facility] [place] [address] [place], [place] [address]    Culture      NO GROWTH < 12 HOURS Performed at [place] [address] [place], [place] [address]    Report Status PENDING   Culture, blood (routine x 2)     Status: None (Preliminary result)   Collection Time: 09/18/20  6:18 AM   Specimen: BLOOD  Result Value Ref Range   Specimen Description      BLOOD LEFT WRIST Performed at [place] [address] [place], [place] [address]    Special Requests      BOTTLES DRAWN AEROBIC ONLY Blood Culture adequate volume Performed at [place] [address], [place], [place] [address]    Culture      NO GROWTH < 12 HOURS Performed at [place] [address] [place], [place] [address]    Report Status PENDING   Heparin level (unfractionated)     Status: Abnormal   Collection Time: 09/18/20  6:18 AM  Result Value Ref Range   Heparin Unfractionated <0.10 (L) 0.30 - 0.70 IU/mL    Comment: (NOTE) If heparin results are below expected values, and patient dosage has  been confirmed, suggest follow up testing of antithrombin III levels. Performed at [facility], [place] [address], [place], [place] [address]   CBC     Status: Abnormal   Collection Time: 09/18/20  6:18 AM  Result Value Ref Range   WBC 12.7 (H) 4.0 - 10.5 K/uL   RBC 2.95 (L) 3.87 - 5.11 MIL/uL   Hemoglobin 10.3 (L) 12.0 - 15.0 g/dL   HCT 31.5 (L) 36.0 - 46.0 %   MCV 106.8 (H) 80.0 - 100.0 fL   MCH 34.9 (H) 26.0 - 34.0 pg   MCHC 32.7 30.0 - 36.0 g/dL   RDW 17.6 (H) 11.5 - 15.5 %   Platelets 99 (L) 150 - 400 K/uL    Comment: REPEATED TO VERIFY PLATELET COUNT CONFIRMED BY SMEAR SPECIMEN  CHECKED FOR CLOTS Immature Platelet Fraction may be clinically indicated, consider ordering this additional test [id number]    nRBC 0.0 0.0 - 0.2 %    Comment: Performed at [facility], [place] [address], [place], [place] [address]  HIV Antibody (routine testing w rflx)     Status: None   Collection Time: 09/18/20  6:18 AM  Result Value Ref Range   HIV Screen 4th Generation wRfx Non Reactive Non Reactive    Comment: Performed at [facility], [place] [address], [place], [place] [address]  Cortisol-am, blood     Status: Abnormal   Collection Time: 09/18/20  6:18 AM  Result Value Ref Range   Cortisol - AM 48.0 (H) 6.7 - 22.6 ug/dL    Comment: RESULTS CONFIRMED BY MANUAL DILUTION Performed at [facility], [place] [address], [place], [place] [address]   Procalcitonin     Status: None   Collection Time: 09/18/20  6:18 AM  Result Value Ref Range   Procalcitonin 14.01 ng/mL    Comment:        Interpretation: PCT >= 10 ng/mL: Important systemic inflammatory response, almost exclusively due to severe bacterial sepsis or septic shock. (NOTE)       Sepsis PCT Algorithm           Lower Respiratory Tract  Infection PCT Algorithm    ----------------------------     ----------------------------         PCT < 0.25 ng/mL                PCT < 0.10 ng/mL          Strongly encourage             Strongly discourage   discontinuation of antibiotics    initiation of antibiotics    ----------------------------     -----------------------------       PCT 0.25 - 0.50 ng/mL            PCT 0.10 - 0.25 ng/mL               OR       >80% decrease in PCT            Discourage initiation of                                            antibiotics      Encourage discontinuation           of antibiotics    ----------------------------     -----------------------------         PCT >= 0.50 ng/mL              PCT 0.26 - 0.50 ng/mL                AND        <80% decrease in PCT             Encourage initiation of                                             antibiotics       Encourage continuation           of antibiotics    ----------------------------     -----------------------------        PCT >= 0.50 ng/mL                  PCT > 0.50 ng/mL               AND         increase in PCT                  Strongly encourage                                      initiation of antibiotics    Strongly encourage escalation           of antibiotics                                     -----------------------------                                           PCT <= 0.25 ng/mL  OR                                        > 80% decrease in PCT                                      Discontinue / Do not initiate                                             antibiotics  Performed at [place] [address] [place], [place] [address]   Creatinine, serum     Status: Abnormal   Collection Time: 09/18/20  6:18 AM  Result Value Ref Range   Creatinine, Ser 1.21 (H) 0.44 - 1.00 mg/dL   GFR, Estimated 53 (L) >60 mL/min    Comment: (NOTE) Calculated using the CKD-EPI Creatinine Equation (2021) Performed at [facility], [place] [address], [place], [place] [address]   Protime-INR     Status: Abnormal   Collection Time: 09/18/20  6:18 AM  Result Value Ref Range   Prothrombin Time 21.8 (H) 11.4 - 15.2 seconds   INR 2.0 (H) 0.8 - 1.2    Comment: (NOTE) INR goal varies based on device and disease states. Performed at [facility], [place] [address], [place], [place] [address]   Blood gas, arterial     Status: Abnormal   Collection Time: 09/18/20  8:20 AM  Result Value Ref Range   FIO2 100.00    pH, Arterial 7.308 (L) 7.350 - 7.450   pCO2 arterial 44.4 32.0 - 48.0 mmHg   pO2, Arterial 58.8 (L) 83.0 - 108.0 mmHg   Bicarbonate 21.6 20.0 - 28.0 mmol/L    Acid-base deficit 4.1 (H) 0.0 - 2.0 mmol/L   O2 Saturation 86.2 %   Patient temperature 98.6     Comment: Performed at [facility], [place] [address], [place], [place] [address]   CT Chest Wo Contrast  Result Date: 09/18/2020 CLINICAL DATA:  Shortness of breath. EXAM: CT CHEST WITHOUT CONTRAST TECHNIQUE: Multidetector CT imaging of the chest was performed following the standard protocol without IV contrast. COMPARISON:  August 03, 2020 FINDINGS: Cardiovascular: There is mild calcification of the aortic arch. Normal heart size with mild to moderate severity coronary artery calcification. No pericardial effusion. Mediastinum/Nodes: No enlarged mediastinal or axillary lymph nodes, however, this is limited in evaluation in the absence of intravenous contrast. Thyroid gland, trachea, and esophagus demonstrate no significant findings. Lungs/Pleura: Marked severity multifocal infiltrates are seen throughout both lungs. Moderate to marked severity consolidation is seen within the right lower lobe. There is a large right-sided pleural effusion. No pneumothorax is identified. Upper Abdomen: No acute abnormality. Musculoskeletal: There is evidence of prior right-sided mastectomy. Multiple stable mixed density lesions are again seen within the thoracic spine. IMPRESSION: 1. Marked severity bilateral multifocal infiltrates with moderate to marked severity right lower lobe consolidation. 2. Large right-sided pleural effusion. 3. Findings consistent with osseous metastasis within the thoracic spine. 4. Evidence of prior right-sided mastectomy. 5. Aortic atherosclerosis. Aortic Atherosclerosis (ICD10-I70.0). Electronically Signed   By: Virgina Norfolk M.D.   On: 09/18/2020 03:48   DG Chest Port 1  View  Result Date: 09/18/2020 CLINICAL DATA:  Status post right thoracentesis. EXAM: PORTABLE CHEST 1 VIEW COMPARISON:  Chest radiograph from one day prior. FINDINGS: Stable left subclavian Port-A-Cath  terminating in the middle third of the SVC. Stable cardiomediastinal silhouette with mild cardiomegaly. No pneumothorax. Small right pleural effusion, significantly decreased. Stable small left pleural effusion. Improved aeration at the right lung base. Persistent diffuse patchy hazy lung opacities, left greater than right. IMPRESSION: 1. No pneumothorax. Small right pleural effusion, significantly decreased. 2. Stable small left pleural effusion. 3. Improved aeration at the right lung base. 4. Persistent diffuse patchy hazy lung opacities, left greater than right, favoring multilobar pneumonia. Electronically Signed   By: Ilona Sorrel M.D.   On: 09/18/2020 16:04   DG Chest Port 1 View  Result Date: 09/17/2020 CLINICAL DATA:  Worsening shortness of breath for 2 weeks EXAM: PORTABLE CHEST 1 VIEW COMPARISON:  CT 08/03/2020, radiograph 05/09/2020 FINDINGS: Interval development of a large right pleural effusion which tracks laterally and over the right lung apex. Likely passive atelectatic change within the regions of adjacent opacity. Some additional perihilar hazy opacity in the right lung and diffusely throughout the left lung is noted with vascular congestion. A left subclavian approach Port-A-Cath is seen in the soft tissues of the left chest wall with the tip positioned near the superior cavoatrial junction. Right heart border largely obscured by adjacent opacity. Visible cardiomediastinal contours are unremarkable. No acute osseous abnormality. Some edematous changes in the soft tissues. Degenerative changes are present in the imaged spine and shoulders. Prior right mastectomy. Telemetry leads overlie the chest. IMPRESSION: 1. Interval development of a large right pleural effusion which tracks laterally and over the right lung apex with adjacent areas of passive atelectatic change 2. Some additional perihilar hazy opacity in the right lung and diffusely throughout the left lung, likely reflect edema though  underlying infection is difficult to exclude. Electronically Signed   By: Lovena Le M.D.   On: 09/17/2020 23:09   US THORACENTESIS ASP PLEURAL SPACE W/IMG GUIDE  Result Date: 09/18/2020 INDICATION: History of metastatic breast cancer. Shortness of breath. Large right pleural effusion. Request therapeutic thoracentesis. EXAM: ULTRASOUND GUIDED RIGHT THORACENTESIS MEDICATIONS: 1% plain lidocaine, 5 mL COMPLICATIONS: None immediate. PROCEDURE: An ultrasound guided thoracentesis was thoroughly discussed with the patient and questions answered. The benefits, risks, alternatives and complications were also discussed. The patient understands and wishes to proceed with the procedure. Written consent was obtained. Ultrasound was performed to localize and mark an adequate pocket of fluid in the right chest. The area was then prepped and draped in the normal sterile fashion. 1% Lidocaine was used for local anesthesia. Under ultrasound guidance a 6 Fr Safe-T-Centesis catheter was introduced. Thoracentesis was performed. The catheter was removed and a dressing applied. FINDINGS: A total of approximately 2 L of slightly hazy, amber colored fluid was removed. IMPRESSION: Successful ultrasound guided right thoracentesis yielding 2 L of pleural fluid. Read by: Ascencion Dike PA-C Electronically Signed   By: Aletta Edouard M.D.   On: 09/18/2020 15:41    Pending Labs Unresulted Labs (From admission, onward)          Start     Ordered   09/18/20 0500  CBC  Daily,   R      09/18/20 0041          Vitals/Pain Today's Vitals   09/18/20 1530 09/18/20 1600 09/18/20 1940 09/18/20 2031  BP: 118/77 123/74 106/72 113/73  Pulse: (!) 103 100 90  87  Resp: (!) 27 19 (!) 21 (!) 22  Temp:      TempSrc:      SpO2: 100% 98% 100% 100%  Weight:      Height:      PainSc:        Isolation Precautions No active isolations  Medications Medications  acetaminophen (TYLENOL) tablet 650 mg (has no administration in time  range)    Or  acetaminophen (TYLENOL) suppository 650 mg (has no administration in time range)  ondansetron (ZOFRAN) tablet 4 mg (has no administration in time range)    Or  ondansetron (ZOFRAN) injection 4 mg (has no administration in time range)  LORazepam (ATIVAN) injection 1 mg (1 mg Intravenous Given 09/18/20 0620)  ceFEPIme (MAXIPIME) 2 g in sodium chloride 0.9 % 100 mL IVPB ( Intravenous Stopped 09/18/20 1632)  vancomycin (VANCOCIN) IVPB 1000 mg/200 mL premix (has no administration in time range)  sodium chloride flush (NS) 0.9 % injection 10-40 mL (has no administration in time range)  Chlorhexidine Gluconate Cloth 2 % PADS 6 each (6 each Topical Given 09/18/20 1451)  dextrose 5 %-0.9 % sodium chloride infusion ( Intravenous Infusion Verify 09/18/20 2013)  lidocaine (XYLOCAINE) 1 % (with pres) injection (has no administration in time range)  magnesium sulfate IVPB 2 g 50 mL (0 g Intravenous Stopped 09/18/20 0014)  methylPREDNISolone sodium succinate (SOLU-MEDROL) 125 mg/2 mL injection 125 mg (125 mg Intravenous Given 09/17/20 2313)  LORazepam (ATIVAN) injection 1 mg (1 mg Intravenous Given 09/17/20 2313)  albuterol (VENTOLIN HFA) 108 (90 Base) MCG/ACT inhaler 8 puff (8 puffs Inhalation Given 09/18/20 0108)  levofloxacin (LEVAQUIN) IVPB 750 mg (0 mg Intravenous Stopped 09/18/20 0209)  vancomycin (VANCOCIN) IVPB 1000 mg/200 mL premix (0 mg Intravenous Stopped 09/18/20 0209)  heparin bolus via infusion 3,000 Units (3,000 Units Intravenous Bolus from Bag 09/18/20 0105)  sodium chloride 0.9 % bolus 500 mL (0 mLs Intravenous Stopped 09/18/20 0616)  hydrOXYzine (VISTARIL) injection 50 mg (50 mg Intramuscular Given 09/18/20 0409)  alteplase (CATHFLO ACTIVASE) injection 2 mg (2 mg Intracatheter Given 09/18/20 7209)    Mobility non-ambulatory     Focused Assessments Pulmonary Assessment Handoff:  Lung sounds: L Breath Sounds: Clear R Breath Sounds: Clear O2 Device: High Flow Nasal  Cannula,NRB (pt is wearing both devices) O2 Flow Rate (L/min): 15 L/min      R Recommendations: See Admitting Provider Note  Report given to:   Additional Notes:

## 2020-09-18 NOTE — Progress Notes (Signed)
ANTICOAGULATION CONSULT NOTE - Initial Consult  Pharmacy Consult for heparin Indication: pulmonary embolus  Allergies  Allergen Reactions  . Wasp Venom Other (See Comments)    Unknown  . Azithromycin Rash    Patient Measurements:   Heparin Dosing Weight: 71.9kg  Vital Signs: Temp: 99.4 F (37.4 C) (12/19 0025) Temp Source: Rectal (12/19 0025) BP: 100/70 (12/19 0000) Pulse Rate: 113 (12/19 0000)  Labs: Recent Labs    09/17/20 2300  HGB 13.7  HCT 42.1  PLT 153  CREATININE 2.07*    CrCl cannot be calculated (Unknown ideal weight.).   Medical History: Past Medical History:  Diagnosis Date  . Cancer ([place])   . Depression      Assessment: 54 yo pt with SOB gradually getting worse over the past 2 weeks.  Pt is a breast cancer pt that has declined any further treatment for her cancer.  Pharmacy consulted to dose heparin for PE.  No prior AC noted  09/18/2020 Baseline aPTT and pt/INR ordered Hgb 13.7 Plts 153 DDimer 9.1 Scr 2.07   Goal of Therapy:  Heparin level 0.3-0.7 units/ml Monitor platelets by anticoagulation protocol   Plan:  Heparin bolus 3000 units Start heparin drip at 1150 units/hr Heparin level in 6 hours Daily CBC  Dolly Rias RPh 09/18/2020, 12:31 AM

## 2020-09-18 NOTE — Progress Notes (Signed)
Pharmacy Antibiotic Note  Shannon Garcia is a 54 y.o. female admitted on 09/17/2020 with SOB.  Pt is a breast cancer pt that has declined any further treatment for her cancer Pharmacy has been consulted for cefepime and vancomycin dosing for pna.  Plan: Vancomycin 1gm IV 1 then 1gm q24h Cefepime 2gm IV q12h Follow renal function cultures and clinical course  Height: 5\' 7"  (170.2 cm) Weight: 68 kg (150 lb) IBW/kg (Calculated) : 61.6  Temp (24hrs), Avg:99.4 F (37.4 C), Min:99.4 F (37.4 C), Max:99.4 F (37.4 C)  Recent Labs  Lab 09/17/20 2300  WBC 18.7*  CREATININE 2.07*    Estimated Creatinine Clearance: 30.2 mL/min (A) (by C-G formula based on SCr of 2.07 mg/dL (H)).    Allergies  Allergen Reactions  . Wasp Venom Other (See Comments)    Unknown  . Azithromycin Rash     Thank you for allowing pharmacy to be a part of this patient's care.  Dolly Rias RPh 09/18/2020, 2:06 AM

## 2020-09-18 NOTE — ED Notes (Signed)
Blood cultures attempted by this nurse, charge nurse, and RT. Unsuccessful x 3.

## 2020-09-18 NOTE — Progress Notes (Signed)
TPA removed from accessed PAC. Flushes well with no blood return noted.

## 2020-09-18 NOTE — Progress Notes (Addendum)
Shannon Garcia is a 54 y.o. female with medical history significant of metastatic breast cancer, depression, who normally gets her care in [place] and apparently has voluntarily stopped treatment coming in with progressive shortness of breath and cough. Patient was evaluated and found to have a large pleural effusion. She also admits sepsis criteria. No fever or chills no nausea vomiting or diarrhea. Patient has not been vaccinated against the Covid. Patient is very anxious. Not forthcoming or some part of the history. Patient is still a full code who voiced her desire to be intubated if it comes to that. It appears she has possible pneumonia also on top of that. COVID-19 screen today is negative. She is also anemic and leukocytosis present. Patient is being admitted for further evaluation and treatment.  ED Course: Temperature 99.4 blood pressure 148/136 pulse 119 respiratory 30 oxygen sat 77% on room air. White count 18.7 CO2 17 BUN 18 creatinine 2.07 calcium 9.3 otherwise rest of the CBC and chemistry within normal. Chest x-ray showed large right-sided pleural effusion and possible infiltrates. COVID-19 screen is negative. D-dimer 9.10. Alkaline phosphatase 269 albumin 2.6 AST 183.  09/18/20: Seen and examined at her bedside in the ED, she is confused and not able to provide a history.  Admitted for sepsis.  Currently on broad-spectrum IV antibiotics cefepime and IV vancomycin, IV fluid NS D5 30 cc/h.  Low IV fluid rate due to significant hypoxia on 15 L.  We will continue to closely monitor and treat as indicated.   IR consulted for possible right thoracentesis.   Palliative care team consulted to assist with establishing goals of care.  Please refer to H&P dictated by my partner Dr. Jonelle Sidle on 09/18/20 for further details of the assessment and plan.

## 2020-09-18 NOTE — Procedures (Signed)
PROCEDURE SUMMARY:  Successful US guided right thoracentesis. Yielded 2 L of hazy amber colored fluid. Pt tolerated procedure well. No immediate complications.  Specimen was not sent for labs. CXR ordered.  EBL < 5 mL  Ascencion Dike PA-C 09/18/2020 3:36 PM

## 2020-09-18 NOTE — H&P (Signed)
History and Physical   Shannon Garcia NOB:[id number] DOB: Jan 14, 1966 DOA: 09/17/2020  Referring MD/NP/PA: Dr. Eulis Foster  PCP: Welford Roche, NP   Outpatient Specialists: In [place] patient no longer on active treatment  Patient coming from: Home  Chief Complaint: Shortness of breath  HPI: Shannon Garcia is a 54 y.o. female with medical history significant of metastatic breast cancer, depression, who normally gets her care in [place] and apparently has voluntarily stopped treatment coming in with progressive shortness of breath and cough. Patient was evaluated and found to have a large pleural effusion. She also admits sepsis criteria. No fever or chills no nausea vomiting or diarrhea. Patient has not been vaccinated against the Covid. Patient is very anxious. Not forthcoming or some part of the history. Patient is still a full code who voiced her desire to be intubated if it comes to that. It appears she has possible pneumonia also on top of that. COVID-19 screen today is negative. She is also anemic and leukocytosis present. Patient is being admitted for further evaluation and treatment.  ED Course: Temperature 99.4 blood pressure 148/136 pulse 119 respiratory 30 oxygen sat 77% on room air. White count 18.7 CO2 17 BUN 18 creatinine 2.07 calcium 9.3 otherwise rest of the CBC and chemistry within normal. Chest x-ray showed large right-sided pleural effusion and possible infiltrates. COVID-19 screen is negative. D-dimer 9.10. Alkaline phosphatase 269 albumin 2.6 AST 183.  Review of Systems: As per HPI otherwise 10 point review of systems negative.    Past Medical History:  Diagnosis Date  . Cancer ([place])   . Depression     History reviewed. No pertinent surgical history.   reports that she has been smoking. She does not have any smokeless tobacco history on file. She reports current alcohol use. She reports that she does not use drugs.  Allergies  Allergen Reactions  . Wasp  Venom Other (See Comments)    Unknown  . Azithromycin Rash    No family history on file.   Prior to Admission medications   Medication Sig Start Date End Date Taking? Authorizing Provider  HYDROcodone-acetaminophen (NORCO) 10-325 MG tablet TAKE ONE TABLET BY MOUTH EVERY 4 HOURS AS NEEDED FOR PAIN 08/29/20   Rosanne Sack A, PA-C  methadone (DOLOPHINE) 10 MG tablet TAKE 1/2 TABLET EVERY 12 HOURS 09/16/20   Marvia Pickles, PA-C    Physical Exam: Vitals:   09/18/20 0015 09/18/20 0025 09/18/20 0030 09/18/20 0043  BP:    (!) 148/136  Pulse: (!) 110  (!) 118 (!) 117  Resp: 19  (!) 21 (!) 22  Temp:  99.4 F (37.4 C)    TempSrc:  Rectal    SpO2: 92%  (!) 78% (!) 82%      Constitutional: Acutely ill looking, no distress Vitals:   09/18/20 0015 09/18/20 0025 09/18/20 0030 09/18/20 0043  BP:    (!) 148/136  Pulse: (!) 110  (!) 118 (!) 117  Resp: 19  (!) 21 (!) 22  Temp:  99.4 F (37.4 C)    TempSrc:  Rectal    SpO2: 92%  (!) 78% (!) 82%   Eyes: PERRL, lids and conjunctivae normal ENMT: Mucous membranes are moist. Posterior pharynx clear of any exudate or lesions.Normal dentition.  Neck: normal, supple, no masses, no thyromegaly Respiratory: Decreased air entry especially on the right with egophony, crackles, no wheeze, some rhonchi, increased respiratory drive. No accessory muscle use.  Cardiovascular: Sinus tachycardia no murmurs / rubs / gallops. No  extremity edema. 2+ pedal pulses. No carotid bruits.  Abdomen: no tenderness, no masses palpated. No hepatosplenomegaly. Bowel sounds positive.  Musculoskeletal: no clubbing / cyanosis. No joint deformity upper and lower extremities. Good ROM, no contractures. Normal muscle tone.  Skin: no rashes, lesions, ulcers. No induration Neurologic: CN 2-12 grossly intact. Sensation intact, DTR normal. Strength 5/5 in all 4.  Psychiatric: Normal judgment and insight. Alert and oriented x 3. Normal mood.     Labs on Admission: I have  personally reviewed following labs and imaging studies  CBC: Recent Labs  Lab 09/17/20 2300  WBC 18.7*  NEUTROABS 16.3*  HGB 13.7  HCT 42.1  MCV 106.6*  PLT 956   Basic Metabolic Panel: Recent Labs  Lab 09/17/20 2300  NA 138  K 4.8  CL 100  CO2 17*  GLUCOSE 86  BUN 18  CREATININE 2.07*  CALCIUM 9.3   GFR: CrCl cannot be calculated (Unknown ideal weight.). Liver Function Tests: Recent Labs  Lab 09/17/20 2300  AST 183*  ALT 39  ALKPHOS 269*  BILITOT 1.1  PROT 7.2  ALBUMIN 2.6*   No results for input(s): LIPASE, AMYLASE in the last 168 hours. No results for input(s): AMMONIA in the last 168 hours. Coagulation Profile: No results for input(s): INR, PROTIME in the last 168 hours. Cardiac Enzymes: No results for input(s): CKTOTAL, CKMB, CKMBINDEX, TROPONINI in the last 168 hours. BNP (last 3 results) No results for input(s): PROBNP in the last 8760 hours. HbA1C: No results for input(s): HGBA1C in the last 72 hours. CBG: No results for input(s): GLUCAP in the last 168 hours. Lipid Profile: No results for input(s): CHOL, HDL, LDLCALC, TRIG, CHOLHDL, LDLDIRECT in the last 72 hours. Thyroid Function Tests: No results for input(s): TSH, T4TOTAL, FREET4, T3FREE, THYROIDAB in the last 72 hours. Anemia Panel: No results for input(s): VITAMINB12, FOLATE, FERRITIN, TIBC, IRON, RETICCTPCT in the last 72 hours. Urine analysis: No results found for: COLORURINE, APPEARANCEUR, LABSPEC, PHURINE, GLUCOSEU, HGBUR, BILIRUBINUR, KETONESUR, PROTEINUR, UROBILINOGEN, NITRITE, LEUKOCYTESUR Sepsis Labs: @LABRCNTIP (procalcitonin:4,lacticidven:4) ) Recent Results (from the past 240 hour(s))  Resp Panel by RT-PCR (Flu A&B, Covid) Nasopharyngeal Swab     Status: None   Collection Time: 09/17/20 10:40 PM   Specimen: Nasopharyngeal Swab; Nasopharyngeal(NP) swabs in vial transport medium  Result Value Ref Range Status   SARS Coronavirus 2 by RT PCR NEGATIVE NEGATIVE Final    Comment:  (NOTE) SARS-CoV-2 target nucleic acids are NOT DETECTED.  The SARS-CoV-2 RNA is generally detectable in upper respiratory specimens during the acute phase of infection. The lowest concentration of SARS-CoV-2 viral copies this assay can detect is 138 copies/mL. A negative result does not preclude SARS-Cov-2 infection and should not be used as the sole basis for treatment or other patient management decisions. A negative result may occur with  improper specimen collection/handling, submission of specimen other than nasopharyngeal swab, presence of viral mutation(s) within the areas targeted by this assay, and inadequate number of viral copies(<138 copies/mL). A negative result must be combined with clinical observations, patient history, and epidemiological information. The expected result is Negative.  Fact Sheet for Patients:  EntrepreneurPulse.com.au  Fact Sheet for Healthcare Providers:  IncredibleEmployment.be  This test is no t yet approved or cleared by the [place] FDA and  has been authorized for detection and/or diagnosis of SARS-CoV-2 by FDA under an Emergency Use Authorization (EUA). This EUA will remain  in effect (meaning this test can be used) for the duration of the COVID-19 declaration under  Section 564(b)(1) of the Act, 21 U.S.C.section 360bbb-3(b)(1), unless the authorization is terminated  or revoked sooner.       Influenza A by PCR NEGATIVE NEGATIVE Final   Influenza B by PCR NEGATIVE NEGATIVE Final    Comment: (NOTE) The Xpert Xpress SARS-CoV-2/FLU/RSV plus assay is intended as an aid in the diagnosis of influenza from Nasopharyngeal swab specimens and should not be used as a sole basis for treatment. Nasal washings and aspirates are unacceptable for Xpert Xpress SARS-CoV-2/FLU/RSV testing.  Fact Sheet for Patients: EntrepreneurPulse.com.au  Fact Sheet for Healthcare  Providers: IncredibleEmployment.be  This test is not yet approved or cleared by the [place] FDA and has been authorized for detection and/or diagnosis of SARS-CoV-2 by FDA under an Emergency Use Authorization (EUA). This EUA will remain in effect (meaning this test can be used) for the duration of the COVID-19 declaration under Section 564(b)(1) of the Act, 21 U.S.C. section 360bbb-3(b)(1), unless the authorization is terminated or revoked.  Performed at [facility], [place] [address], [place], [place] [address]      Radiological Exams on Admission: DG Chest Port 1 View  Result Date: 09/17/2020 CLINICAL DATA:  Worsening shortness of breath for 2 weeks EXAM: PORTABLE CHEST 1 VIEW COMPARISON:  CT 08/03/2020, radiograph 05/09/2020 FINDINGS: Interval development of a large right pleural effusion which tracks laterally and over the right lung apex. Likely passive atelectatic change within the regions of adjacent opacity. Some additional perihilar hazy opacity in the right lung and diffusely throughout the left lung is noted with vascular congestion. A left subclavian approach Port-A-Cath is seen in the soft tissues of the left chest wall with the tip positioned near the superior cavoatrial junction. Right heart border largely obscured by adjacent opacity. Visible cardiomediastinal contours are unremarkable. No acute osseous abnormality. Some edematous changes in the soft tissues. Degenerative changes are present in the imaged spine and shoulders. Prior right mastectomy. Telemetry leads overlie the chest. IMPRESSION: 1. Interval development of a large right pleural effusion which tracks laterally and over the right lung apex with adjacent areas of passive atelectatic change 2. Some additional perihilar hazy opacity in the right lung and diffusely throughout the left lung, likely reflect edema though underlying infection is difficult to exclude. Electronically  Signed   By: Lovena Le M.D.   On: 09/17/2020 23:09    Assessment/Plan Principal Problem:   Acute on chronic respiratory failure with hypoxemia [facility]) Active Problems:   Malignant neoplasm of upper-outer quadrant of right female breast (HCC)   Cancer, metastatic to bone (HCC)   Recurrent right pleural effusion   Leucocytosis   Sepsis ([place])     #1 acute on chronic respiratory failure with hypoxemia: Secondary to large right-sided pleural effusion and pneumonia. No evidence of PE. Patient will be maintained on oxygen and antibiotics. We will do therapeutic thoracentesis for symptom control mainly. Patient has bluntly create treatment. Currently on high flow oxygen. BiPAP if needed.  #2 malignant breast cancer: Patient again has quit treatment voluntarily. Continue mainly supportive care.  #3 sepsis: Suspected underlying pneumonia. Continue treatment  #4 leukocytosis: Continue to monitor white count.   DVT prophylaxis: Heparin Code Status: Full code Family Communication: No family at bedside Disposition Plan: Home Consults called: None but IR Admission status: Inpatient  Severity of Illness: The appropriate patient status for this patient is INPATIENT. Inpatient status is judged to be reasonable and necessary in order to provide the required intensity of service to ensure the patient's safety.  The patient's presenting symptoms, physical exam findings, and initial radiographic and laboratory data in the context of their chronic comorbidities is felt to place them at high risk for further clinical deterioration. Furthermore, it is not anticipated that the patient will be medically stable for discharge from the hospital within 2 midnights of admission. The following factors support the patient status of inpatient.   " The patient's presenting symptoms include shortness of breath. " The worrisome physical exam findings include decreased air entry on the right with some crackles. " The  initial radiographic and laboratory data are worrisome because of evidence of right-sided pleural effusion. " The chronic co-morbidities include metastatic breast cancer.   * I certify that at the point of admission it is my clinical judgment that the patient will require inpatient hospital care spanning beyond 2 midnights from the point of admission due to high intensity of service, high risk for further deterioration and high frequency of surveillance required.Barbette Merino MD [facility] Pager [phone]  If 7PM-7AM, please contact night-coverage [email or web address] Password [facility]  09/18/2020, 12:47 AM

## 2020-09-18 NOTE — ED Notes (Signed)
Date and time results received: 09/18/20 0122 (use smartphrase ".now" to insert current time)  Test: lactic acid Critical Value:7.4 Name of Provider Notified: Jonelle Sidle MD Orders Received? Or Actions Taken?: waiting on orders

## 2020-09-19 ENCOUNTER — Encounter (HOSPITAL_COMMUNITY): Payer: Self-pay | Admitting: Certified Registered Nurse Anesthetist

## 2020-09-19 ENCOUNTER — Inpatient Hospital Stay (HOSPITAL_COMMUNITY): Payer: Medicare Other

## 2020-09-19 DIAGNOSIS — Z170 Estrogen receptor positive status [ER+]: Secondary | ICD-10-CM

## 2020-09-19 DIAGNOSIS — D72829 Elevated white blood cell count, unspecified: Secondary | ICD-10-CM

## 2020-09-19 DIAGNOSIS — Z66 Do not resuscitate: Secondary | ICD-10-CM

## 2020-09-19 DIAGNOSIS — J189 Pneumonia, unspecified organism: Secondary | ICD-10-CM

## 2020-09-19 DIAGNOSIS — A419 Sepsis, unspecified organism: Principal | ICD-10-CM

## 2020-09-19 DIAGNOSIS — R6520 Severe sepsis without septic shock: Secondary | ICD-10-CM

## 2020-09-19 DIAGNOSIS — J9621 Acute and chronic respiratory failure with hypoxia: Secondary | ICD-10-CM

## 2020-09-19 DIAGNOSIS — Z515 Encounter for palliative care: Secondary | ICD-10-CM

## 2020-09-19 DIAGNOSIS — C7951 Secondary malignant neoplasm of bone: Secondary | ICD-10-CM

## 2020-09-19 LAB — STREP PNEUMONIAE URINARY ANTIGEN: Strep Pneumo Urinary Antigen: NEGATIVE

## 2020-09-19 LAB — CBC
HCT: 37.3 % (ref 36.0–46.0)
Hemoglobin: 12.4 g/dL (ref 12.0–15.0)
MCH: 34.7 pg — ABNORMAL HIGH (ref 26.0–34.0)
MCHC: 33.2 g/dL (ref 30.0–36.0)
MCV: 104.5 fL — ABNORMAL HIGH (ref 80.0–100.0)
Platelets: 59 10*3/uL — ABNORMAL LOW (ref 150–400)
RBC: 3.57 MIL/uL — ABNORMAL LOW (ref 3.87–5.11)
RDW: 17.7 % — ABNORMAL HIGH (ref 11.5–15.5)
WBC: 12.1 10*3/uL — ABNORMAL HIGH (ref 4.0–10.5)
nRBC: 0 % (ref 0.0–0.2)

## 2020-09-19 LAB — PROCALCITONIN: Procalcitonin: 15.72 ng/mL

## 2020-09-19 LAB — URINALYSIS, ROUTINE W REFLEX MICROSCOPIC
Bilirubin Urine: NEGATIVE
Glucose, UA: NEGATIVE mg/dL
Hgb urine dipstick: NEGATIVE
Ketones, ur: 5 mg/dL — AB
Nitrite: NEGATIVE
Protein, ur: NEGATIVE mg/dL
Specific Gravity, Urine: 1.024 (ref 1.005–1.030)
pH: 5 (ref 5.0–8.0)

## 2020-09-19 LAB — COMPREHENSIVE METABOLIC PANEL
ALT: 36 U/L (ref 0–44)
AST: 115 U/L — ABNORMAL HIGH (ref 15–41)
Albumin: 2.4 g/dL — ABNORMAL LOW (ref 3.5–5.0)
Alkaline Phosphatase: 234 U/L — ABNORMAL HIGH (ref 38–126)
Anion gap: 9 (ref 5–15)
BUN: 35 mg/dL — ABNORMAL HIGH (ref 6–20)
CO2: 25 mmol/L (ref 22–32)
Calcium: 9.3 mg/dL (ref 8.9–10.3)
Chloride: 104 mmol/L (ref 98–111)
Creatinine, Ser: 0.81 mg/dL (ref 0.44–1.00)
GFR, Estimated: >60 mL/min (ref >60)
Glucose, Bld: 93 mg/dL (ref 70–99)
Potassium: 4.7 mmol/L (ref 3.5–5.1)
Sodium: 138 mmol/L (ref 135–145)
Total Bilirubin: 1.3 mg/dL — ABNORMAL HIGH (ref 0.3–1.2)
Total Protein: 7.3 g/dL (ref 6.5–8.1)

## 2020-09-19 LAB — LACTIC ACID, PLASMA: Lactic Acid, Venous: 2.1 mmol/L (ref 0.5–1.9)

## 2020-09-19 LAB — MAGNESIUM: Magnesium: 2.5 mg/dL — ABNORMAL HIGH (ref 1.7–2.4)

## 2020-09-19 LAB — LACTATE DEHYDROGENASE: LDH: 579 U/L — ABNORMAL HIGH (ref 98–192)

## 2020-09-19 MED ORDER — ENSURE ENLIVE PO LIQD
237.0000 mL | Freq: Two times a day (BID) | ORAL | Status: DC
  Administered 2020-09-19 – 2020-09-23 (×8): 237 mL via ORAL

## 2020-09-19 MED ORDER — GUAIFENESIN ER 600 MG PO TB12
1200.0000 mg | ORAL_TABLET | Freq: Two times a day (BID) | ORAL | Status: DC
  Administered 2020-09-19 – 2020-09-23 (×7): 1200 mg via ORAL
  Filled 2020-09-19 (×8): qty 2

## 2020-09-19 MED ORDER — BUDESONIDE 0.5 MG/2ML IN SUSP
0.5000 mg | Freq: Two times a day (BID) | RESPIRATORY_TRACT | Status: DC
  Administered 2020-09-19 – 2020-09-23 (×9): 0.5 mg via RESPIRATORY_TRACT
  Filled 2020-09-19 (×9): qty 2

## 2020-09-19 MED ORDER — IPRATROPIUM-ALBUTEROL 0.5-2.5 (3) MG/3ML IN SOLN
3.0000 mL | Freq: Three times a day (TID) | RESPIRATORY_TRACT | Status: DC
  Administered 2020-09-19 – 2020-09-23 (×13): 3 mL via RESPIRATORY_TRACT
  Filled 2020-09-19 (×13): qty 3

## 2020-09-19 MED ORDER — ROPINIROLE HCL 1 MG PO TABS
0.5000 mg | ORAL_TABLET | Freq: Every day | ORAL | Status: DC
  Administered 2020-09-19 – 2020-09-22 (×4): 0.5 mg via ORAL
  Filled 2020-09-19 (×4): qty 1

## 2020-09-19 MED ORDER — FLUTICASONE PROPIONATE 50 MCG/ACT NA SUSP
2.0000 | Freq: Every day | NASAL | Status: DC
  Administered 2020-09-19 – 2020-09-23 (×5): 2 via NASAL
  Filled 2020-09-19: qty 16

## 2020-09-19 MED ORDER — LORATADINE 10 MG PO TABS
10.0000 mg | ORAL_TABLET | Freq: Every day | ORAL | Status: DC
  Administered 2020-09-19 – 2020-09-23 (×5): 10 mg via ORAL
  Filled 2020-09-19 (×5): qty 1

## 2020-09-19 MED ORDER — PANTOPRAZOLE SODIUM 40 MG PO TBEC
40.0000 mg | DELAYED_RELEASE_TABLET | Freq: Every day | ORAL | Status: DC
  Administered 2020-09-19: 10:00:00 40 mg via ORAL
  Filled 2020-09-19: qty 1

## 2020-09-19 MED ORDER — FUROSEMIDE 10 MG/ML IJ SOLN
20.0000 mg | Freq: Two times a day (BID) | INTRAMUSCULAR | Status: DC
  Administered 2020-09-19 – 2020-09-20 (×3): 20 mg via INTRAVENOUS
  Filled 2020-09-19 (×3): qty 2

## 2020-09-19 MED ORDER — BUSPIRONE HCL 5 MG PO TABS
5.0000 mg | ORAL_TABLET | Freq: Two times a day (BID) | ORAL | Status: DC
Start: 2020-09-19 — End: 2020-09-23
  Administered 2020-09-19 – 2020-09-23 (×9): 5 mg via ORAL
  Filled 2020-09-19 (×9): qty 1

## 2020-09-19 MED ORDER — SODIUM CHLORIDE 0.9 % IV SOLN
2.0000 g | Freq: Three times a day (TID) | INTRAVENOUS | Status: DC
  Administered 2020-09-19 – 2020-09-23 (×13): 2 g via INTRAVENOUS
  Filled 2020-09-19 (×14): qty 2

## 2020-09-19 MED ORDER — VANCOMYCIN HCL 1750 MG/350ML IV SOLN
1750.0000 mg | INTRAVENOUS | Status: DC
  Administered 2020-09-19 – 2020-09-22 (×4): 1750 mg via INTRAVENOUS
  Filled 2020-09-19 (×7): qty 350

## 2020-09-19 MED ORDER — SERTRALINE HCL 100 MG PO TABS
100.0000 mg | ORAL_TABLET | Freq: Two times a day (BID) | ORAL | Status: DC
  Administered 2020-09-19 – 2020-09-23 (×9): 100 mg via ORAL
  Filled 2020-09-19 (×9): qty 1

## 2020-09-19 MED ORDER — PANTOPRAZOLE SODIUM 40 MG PO TBEC
40.0000 mg | DELAYED_RELEASE_TABLET | Freq: Every day | ORAL | Status: DC
Start: 2020-09-19 — End: 2020-09-19

## 2020-09-19 NOTE — TOC Initial Note (Signed)
Transition of Care [facility]) - Initial/Assessment Note    Patient Details  Name: Frayda Egley MRN: [id number] Date of Birth: 05-24-1966  Transition of Care [facility]) CM/SW Contact:    Leeroy Cha, RN Phone Number: 09/19/2020, 8:32 AM  Clinical Narrative:                  54 y.o. female with medical history significant of metastatic breast cancer, depression, who normally gets her care in [place] and apparently has voluntarily stopped treatment coming in with progressive shortness of breath and cough. Patient was evaluated and found to have a large pleural effusion. She also admits sepsis criteria. No fever or chills no nausea vomiting or diarrhea. Patient has not been vaccinated against the Covid. Patient is very anxious. Not forthcoming or some part of the history. Patient is still a full code who voiced her desire to be intubated if it comes to that. It appears she has possible pneumonia also on top of that. COVID-19 screen today is negative. She is also anemic and leukocytosis present. Patient is being admitted for further evaluation and treatment. PLAN:  TO HOME WITH SELF CARE HAS FAMILY IN THE AREA LIVES BY HERSELF FOLLOWING FOR PROGRESSION. Expected Discharge Plan: Home/Self Care Barriers to Discharge: No Barriers Identified   Patient Goals and CMS Choice Patient states their goals for this hospitalization and ongoing recovery are:: to go home CMS Medicare.gov Compare Post Acute Care list provided to:: Patient    Expected Discharge Plan and Services Expected Discharge Plan: Home/Self Care   Discharge Planning Services: CM Consult   Living arrangements for the past 2 months: Apartment                                      Prior Living Arrangements/Services Living arrangements for the past 2 months: Apartment Lives with:: Self Patient language and need for interpreter reviewed:: Yes Do you feel safe going back to the place where you live?: Yes      Need for  Family Participation in Patient Care: Yes (Comment) Care giver support system in place?: Yes (comment)   Criminal Activity/Legal Involvement Pertinent to Current Situation/Hospitalization: No - Comment as needed  Activities of Daily Living Home Assistive Devices/Equipment: None ADL Screening (condition at time of admission) Patient's cognitive ability adequate to safely complete daily activities?: No Is the patient deaf or have difficulty hearing?: No Does the patient have difficulty seeing, even when wearing glasses/contacts?: No Does the patient have difficulty concentrating, remembering, or making decisions?: Yes Patient able to express need for assistance with ADLs?: Yes Does the patient have difficulty dressing or bathing?: No Independently performs ADLs?: No Communication: Independent Dressing (OT): Needs assistance Is this a change from baseline?: Change from baseline, expected to last <3days Grooming: Needs assistance Is this a change from baseline?: Change from baseline, expected to last <3 days Feeding: Needs assistance Is this a change from baseline?: Change from baseline, expected to last <3 days Bathing: Needs assistance Is this a change from baseline?: Change from baseline, expected to last <3 days Toileting: Needs assistance Is this a change from baseline?: Change from baseline, expected to last <3 days In/Out Bed: Needs assistance Is this a change from baseline?: Change from baseline, expected to last <3 days Walks in Home: Needs assistance Is this a change from baseline?: Change from baseline, expected to last <3 days Does the patient have difficulty walking  or climbing stairs?: No Weakness of Legs: None Weakness of Arms/Hands: None  Permission Sought/Granted                  Emotional Assessment Appearance:: Appears stated age Attitude/Demeanor/Rapport: Engaged Affect (typically observed): Calm Orientation: : Oriented to Self,Oriented to Place,Oriented  to  Time,Oriented to Situation Alcohol / Substance Use: Not Applicable Psych Involvement: No (comment)  Admission diagnosis:  Pleural effusion [J90] Hypoxia [R09.02] Pleural effusion on right [J90] Pleural effusion, right [J90] Acute respiratory failure with hypoxia ([place]) [J96.01] AKI (acute kidney injury) ([place]) [N17.9] Acute on chronic respiratory failure with hypoxemia ([place]) [J96.21] Community acquired pneumonia of right lung, unspecified part of lung [J18.9] Patient Active Problem List   Diagnosis Date Noted  . Recurrent right pleural effusion 09/18/2020  . Leucocytosis 09/18/2020  . Sepsis ([place]) 09/18/2020  . Acute on chronic respiratory failure with hypoxemia ([place]) 09/18/2020  . Acute respiratory failure with hypoxia ([place]) 09/18/2020  . Anemia, vitamin B12 deficiency 06/26/2020  . Cancer, metastatic to bone ([place]) 06/26/2020  . Malignant neoplasm of upper-outer quadrant of right female breast ([place]) 06/19/2020   PCP:  Welford Roche, NP Pharmacy:   [facility] [place] [address] Phone: [phone] Fax: [phone]     Social Determinants of Health (SDOH) Interventions    Readmission Risk Interventions No flowsheet data found.

## 2020-09-19 NOTE — Progress Notes (Addendum)
PROGRESS NOTE    Shannon Garcia  ZTI:[id number] DOB: [birth year] DOA: 09/17/2020 PCP: Shannon Roche, NP    Chief Complaint  Patient presents with  . Shortness of Breath    Brief Narrative:  HPI per Dr. Lloyd Garcia Shannon Garcia is a 54 y.o. female with medical history significant of metastatic breast cancer, depression, who normally gets her care in [place] and apparently has voluntarily stopped treatment coming in with progressive shortness of breath and cough. Patient was evaluated and found to have a large pleural effusion. She also admits sepsis criteria. No fever or chills no nausea vomiting or diarrhea. Patient has not been vaccinated against the Covid. Patient is very anxious. Not forthcoming or some part of the history. Patient is still a full code who voiced her desire to be intubated if it comes to that. It appears she has possible pneumonia also on top of that. COVID-19 screen today is negative. She is also anemic and leukocytosis present. Patient is being admitted for further evaluation and treatment.  ED Course: Temperature 99.4 blood pressure 148/136 pulse 119 respiratory 30 oxygen sat 77% on room air. White count 18.7 CO2 17 BUN 18 creatinine 2.07 calcium 9.3 otherwise rest of the CBC and chemistry within normal. Chest x-ray showed large right-sided pleural effusion and possible infiltrates. COVID-19 screen is negative. D-dimer 9.10. Alkaline phosphatase 269 albumin 2.6 AST 183.  Assessment & Plan:   Principal Problem:   Acute on chronic respiratory failure with hypoxemia (HCC) Active Problems:   Malignant neoplasm of upper-outer quadrant of right female breast (HCC)   Cancer, metastatic to bone (HCC)   Recurrent right pleural effusion   Leucocytosis   Sepsis (HCC)   Acute respiratory failure with hypoxia (HCC)  1 acute on chronic respiratory failure with hypoxemia likely secondary to large right-sided pleural effusion/?  Parapneumonic effusion and  pneumonia Patient presented with acute on chronic respiratory failure with significant hypoxia currently on 15 L high flow nasal cannula.  Patient noted to have sats of 77% on room air on admission.  Patient noted to have a leukocytosis of 18.7, progressive worsening shortness of breath and cough.  Patient status post therapeutic ultrasound-guided paracentesis with 2 L of hazy amber-colored fluid removed 09/18/2020.  Unfortunately labs were not sent.  COVID-19 PCR negative.  Influenza A and B PCR negative.  Blood cultures pending.  Check a sputum Gram stain and culture.  Check urine Legionella antigen.  Check urine pneumococcus antigen.  Repeat chest x-ray this morning with small right pleural effusion, overall increasing airspace opacity bilaterally which may represent progression of multifocal pneumonia.  A degree of underlying pulmonary edema cannot be excluded.  Will place on Pulmicort, scheduled duo nebs, Flonase, Mucinex, Claritin, PPI.  Continue IV cefepime and IV vancomycin.  We will give a dose of Lasix 20 mg IV every 12 hours x2.  Strict I's and O's.  Daily weights.  Check a comprehensive metabolic profile, CBC with differential, LDH.  Unfortunately unable to add labs to thoracentesis done 09/18/2020.  Consult with PCCM for further evaluation and management.  2.  Sepsis secondary to pneumonia Patient presenting with criteria meeting for sepsis including acute on chronic hypoxic respiratory failure, large right-sided pleural effusion, noted to have a pneumonia on imaging.  Patient also noted to have a leukocytosis on admission, tachycardic, tachypneic with hypoxia.  Patient also noted to have a elevated lactic acid level of 5.8, elevated procalcitonin level.  Blood cultures pending.  Ultrasound-guided thoracentesis done and labs unfortunately were  not sent.  Check a sputum Gram stain and culture.  Check a urine culture.  Continue empiric IV cefepime and IV vancomycin.  Follow.  3.  Metastatic  malignant breast cancer Patient stated has had breast cancer for approximately 11 years.  Status post right mastectomy.  Patient noted to have quit treatment voluntarily.  Palliative care consultation for goals of care.  4.  Leukocytosis Secondary to problems #1 and 2.  Blood cultures pending.  Check a UA with cultures and sensitivities.  Check a sputum Gram stain and culture.Continue IV antibiotics.  5.  Gastroesophageal reflux disease Resume PPI.  6.  Depression/anxiety Resume home regimen Zoloft.  Patient noted to be on scheduled Xanax at home.  Patient receiving as needed Ativan during the hospitalization.  Will place on BuSpar twice daily.  Continue as needed Ativan.  7.  Hypertension Patient noted to be on antihypertensive medication of lisinopril prior to admission.  Blood pressure currently stable.  Hold off on antihypertensive medications for now.  Follow.  8.??  Chronic pain syndrome Likely secondary to metastatic breast cancer.  Patient noted to be on methadone and Norco prior to admission.  Patient currently somewhat drowsy and as such we will hold off on pain medication at this time.  If drowsiness improves may resume home pain regiment at half home dose.  DVT prophylaxis: SCDs Code Status: Full Family Communication: Updated patient.  No family at bedside. Disposition:   Status is: Inpatient    Dispo:  Patient From: Home  Planned Disposition: Home  Expected discharge date: 09/21/2020  Medically stable for discharge: No        Consultants:   IR  Procedures:   Ultrasound-guided thoracentesis 2 L hazy amber-colored fluid removed per IR, Shannon Dike, PA 09/18/2020  CT chest without contrast 09/18/2020  Chest x-ray 09/17/2020, 09/18/2020: 09/19/2020  Antimicrobials:  IV cefepime 09/18/2020>>>>  IV Levaquin 12/1 /2021x1 dose  IV vancomycin 09/18/2020>>>.     Subjective: Patient somewhat drowsy but arousable and answering some questions  appropriately.  States some improvement with shortness of breath since admission.  Denies any chest pain.  States she feels stuff moving in her chest.  On 15 L high flow nasal cannula.  Objective: Vitals:   09/19/20 0500 09/19/20 0600 09/19/20 0700 09/19/20 0800  BP: 112/69 111/72 111/80 112/89  Pulse: 81 80 81 87  Resp: (!) 24 20 20 20   Temp:    (!) 97.3 F (36.3 C)  TempSrc:    Oral  SpO2: 94% 96% 93% (!) 83%  Weight:      Height:        Intake/Output Summary (Last 24 hours) at 09/19/2020 0934 Last data filed at 09/19/2020 0644 Gross per 24 hour  Intake 939.51 ml  Output --  Net 939.51 ml   Filed Weights   09/18/20 0048  Weight: 68 kg    Examination:  General exam: On nonrebreather.  Some use of accessory muscles of respiration.  Status post right mastectomy. Respiratory system: Diffuse bilateral crackles/coarse breath sounds L > R.  No wheezing noted.  Some use of accessory muscles of respiration.  Cardiovascular system: S1 & S2 heard, RRR. No JVD, murmurs, rubs, gallops or clicks. No pedal edema. Gastrointestinal system: Abdomen is nondistended, soft and nontender. No organomegaly or masses felt. Normal bowel sounds heard. Central nervous system: Somewhat drowsy. No focal neurological deficits. Extremities: Symmetric 5 x 5 power. Skin: No rashes, lesions or ulcers Psychiatry: Judgement and insight appear normal. Mood & affect appropriate.  Data Reviewed: I have personally reviewed following labs and imaging studies  CBC: Recent Labs  Lab 09/17/20 2300 09/18/20 0618 09/19/20 0213  WBC 18.7* 12.7* 12.1*  NEUTROABS 16.3*  --   --   HGB 13.7 10.3* 12.4  HCT 42.1 31.5* 37.3  MCV 106.6* 106.8* 104.5*  PLT 153 99* 59*    Basic Metabolic Panel: Recent Labs  Lab 09/17/20 2300 09/18/20 0618 09/19/20 0808  NA 138  --   --   K 4.8  --   --   CL 100  --   --   CO2 17*  --   --   GLUCOSE 86  --   --   BUN 18  --   --   CREATININE 2.07* 1.21*  --    CALCIUM 9.3  --   --   MG  --   --  2.5*    GFR: Estimated Creatinine Clearance: 51.7 mL/min (A) (by C-G formula based on SCr of 1.21 mg/dL (H)).  Liver Function Tests: Recent Labs  Lab 09/17/20 2300  AST 183*  ALT 39  ALKPHOS 269*  BILITOT 1.1  PROT 7.2  ALBUMIN 2.6*    CBG: No results for input(s): GLUCAP in the last 168 hours.   Recent Results (from the past 240 hour(s))  Resp Panel by RT-PCR (Flu A&B, Covid) Nasopharyngeal Swab     Status: None   Collection Time: 09/17/20 10:40 PM   Specimen: Nasopharyngeal Swab; Nasopharyngeal(NP) swabs in vial transport medium  Result Value Ref Range Status   SARS Coronavirus 2 by RT PCR NEGATIVE NEGATIVE Final    Comment: (NOTE) SARS-CoV-2 target nucleic acids are NOT DETECTED.  The SARS-CoV-2 RNA is generally detectable in upper respiratory specimens during the acute phase of infection. The lowest concentration of SARS-CoV-2 viral copies this assay can detect is 138 copies/mL. A negative result does not preclude SARS-Cov-2 infection and should not be used as the sole basis for treatment or other patient management decisions. A negative result may occur with  improper specimen collection/handling, submission of specimen other than nasopharyngeal swab, presence of viral mutation(s) within the areas targeted by this assay, and inadequate number of viral copies(<138 copies/mL). A negative result must be combined with clinical observations, patient history, and epidemiological information. The expected result is Negative.  Fact Sheet for Patients:  EntrepreneurPulse.com.au  Fact Sheet for Healthcare Providers:  IncredibleEmployment.be  This test is no t yet approved or cleared by the [place] FDA and  has been authorized for detection and/or diagnosis of SARS-CoV-2 by FDA under an Emergency Use Authorization (EUA). This EUA will remain  in effect (meaning this test can be used) for the  duration of the COVID-19 declaration under Section 564(b)(1) of the Act, 21 U.S.C.section 360bbb-3(b)(1), unless the authorization is terminated  or revoked sooner.       Influenza A by PCR NEGATIVE NEGATIVE Final   Influenza B by PCR NEGATIVE NEGATIVE Final    Comment: (NOTE) The Xpert Xpress SARS-CoV-2/FLU/RSV plus assay is intended as an aid in the diagnosis of influenza from Nasopharyngeal swab specimens and should not be used as a sole basis for treatment. Nasal washings and aspirates are unacceptable for Xpert Xpress SARS-CoV-2/FLU/RSV testing.  Fact Sheet for Patients: EntrepreneurPulse.com.au  Fact Sheet for Healthcare Providers: IncredibleEmployment.be  This test is not yet approved or cleared by the [place] FDA and has been authorized for detection and/or diagnosis of SARS-CoV-2 by FDA under an Emergency Use Authorization (EUA). This EUA  will remain in effect (meaning this test can be used) for the duration of the COVID-19 declaration under Section 564(b)(1) of the Act, 21 U.S.C. section 360bbb-3(b)(1), unless the authorization is terminated or revoked.  Performed at [facility], [place] [address], [place], [place] [address]   Culture, blood (routine x 2)     Status: None (Preliminary result)   Collection Time: 09/18/20  6:18 AM   Specimen: BLOOD  Result Value Ref Range Status   Specimen Description   Final    BLOOD LEFT ANTECUBITAL Performed at [facility] [address], [place], [place] [address]    Special Requests   Final    BOTTLES DRAWN AEROBIC ONLY Blood Culture results may not be optimal due to an excessive volume of blood received in culture bottles Performed at [facility] [address], [place], [place] [address]    Culture   Final    NO GROWTH < 24 HOURS Performed at [facility] [address], [place], [place] [address]    Report Status  PENDING  Incomplete  Culture, blood (routine x 2)     Status: None (Preliminary result)   Collection Time: 09/18/20  6:18 AM   Specimen: BLOOD  Result Value Ref Range Status   Specimen Description   Final    BLOOD LEFT WRIST Performed at [facility] [address] [place], [place] [address]    Special Requests   Final    BOTTLES DRAWN AEROBIC ONLY Blood Culture adequate volume Performed at [facility] [address] [place], [place] [address]    Culture   Final    NO GROWTH < 24 HOURS Performed at [facility] [address] [place], [place] [address]    Report Status PENDING  Incomplete         Radiology Studies: CT Chest Wo Contrast  Result Date: 09/18/2020 CLINICAL DATA:  Shortness of breath. EXAM: CT CHEST WITHOUT CONTRAST TECHNIQUE: Multidetector CT imaging of the chest was performed following the standard protocol without IV contrast. COMPARISON:  August 03, 2020 FINDINGS: Cardiovascular: There is mild calcification of the aortic arch. Normal heart size with mild to moderate severity coronary artery calcification. No pericardial effusion. Mediastinum/Nodes: No enlarged mediastinal or axillary lymph nodes, however, this is limited in evaluation in the absence of intravenous contrast. Thyroid gland, trachea, and esophagus demonstrate no significant findings. Lungs/Pleura: Marked severity multifocal infiltrates are seen throughout both lungs. Moderate to marked severity consolidation is seen within the right lower lobe. There is a large right-sided pleural effusion. No pneumothorax is identified. Upper Abdomen: No acute abnormality. Musculoskeletal: There is evidence of prior right-sided mastectomy. Multiple stable mixed density lesions are again seen within the thoracic spine. IMPRESSION: 1. Marked severity bilateral multifocal infiltrates with moderate to marked severity right lower lobe consolidation. 2. Large right-sided pleural  effusion. 3. Findings consistent with osseous metastasis within the thoracic spine. 4. Evidence of prior right-sided mastectomy. 5. Aortic atherosclerosis. Aortic Atherosclerosis (ICD10-I70.0). Electronically Signed   By: Virgina Norfolk M.D.   On: 09/18/2020 03:48   DG CHEST PORT 1 VIEW  Result Date: 09/19/2020 CLINICAL DATA:  Shortness of breath EXAM: PORTABLE CHEST 1 VIEW COMPARISON:  September 18, 2020 FINDINGS: Port-A-Cath tip is in the superior vena cava. No pneumothorax. There is an overall mild increase in diffuse airspace opacity bilaterally. There is a small right pleural effusion. Heart is upper normal in size with pulmonary vascularity  within normal limits. No adenopathy. No bone lesions. IMPRESSION: Small right pleural effusion. Overall increase in airspace opacity bilaterally which may represent progression of multifocal pneumonia. A degree of underlying pulmonary edema cannot be excluded. Both edema and pneumonia may be present concurrently. Stable cardiac silhouette. Stable Port-A-Cath placement. Electronically Signed   By: Lowella Grip III M.D.   On: 09/19/2020 08:24   DG Chest Port 1 View  Result Date: 09/18/2020 CLINICAL DATA:  Status post right thoracentesis. EXAM: PORTABLE CHEST 1 VIEW COMPARISON:  Chest radiograph from one day prior. FINDINGS: Stable left subclavian Port-A-Cath terminating in the middle third of the SVC. Stable cardiomediastinal silhouette with mild cardiomegaly. No pneumothorax. Small right pleural effusion, significantly decreased. Stable small left pleural effusion. Improved aeration at the right lung base. Persistent diffuse patchy hazy lung opacities, left greater than right. IMPRESSION: 1. No pneumothorax. Small right pleural effusion, significantly decreased. 2. Stable small left pleural effusion. 3. Improved aeration at the right lung base. 4. Persistent diffuse patchy hazy lung opacities, left greater than right, favoring multilobar pneumonia.  Electronically Signed   By: Ilona Sorrel M.D.   On: 09/18/2020 16:04   DG Chest Port 1 View  Result Date: 09/17/2020 CLINICAL DATA:  Worsening shortness of breath for 2 weeks EXAM: PORTABLE CHEST 1 VIEW COMPARISON:  CT 08/03/2020, radiograph 05/09/2020 FINDINGS: Interval development of a large right pleural effusion which tracks laterally and over the right lung apex. Likely passive atelectatic change within the regions of adjacent opacity. Some additional perihilar hazy opacity in the right lung and diffusely throughout the left lung is noted with vascular congestion. A left subclavian approach Port-A-Cath is seen in the soft tissues of the left chest wall with the tip positioned near the superior cavoatrial junction. Right heart border largely obscured by adjacent opacity. Visible cardiomediastinal contours are unremarkable. No acute osseous abnormality. Some edematous changes in the soft tissues. Degenerative changes are present in the imaged spine and shoulders. Prior right mastectomy. Telemetry leads overlie the chest. IMPRESSION: 1. Interval development of a large right pleural effusion which tracks laterally and over the right lung apex with adjacent areas of passive atelectatic change 2. Some additional perihilar hazy opacity in the right lung and diffusely throughout the left lung, likely reflect edema though underlying infection is difficult to exclude. Electronically Signed   By: Lovena Le M.D.   On: 09/17/2020 23:09   US THORACENTESIS ASP PLEURAL SPACE W/IMG GUIDE  Result Date: 09/18/2020 INDICATION: History of metastatic breast cancer. Shortness of breath. Large right pleural effusion. Request therapeutic thoracentesis. EXAM: ULTRASOUND GUIDED RIGHT THORACENTESIS MEDICATIONS: 1% plain lidocaine, 5 mL COMPLICATIONS: None immediate. PROCEDURE: An ultrasound guided thoracentesis was thoroughly discussed with the patient and questions answered. The benefits, risks, alternatives and  complications were also discussed. The patient understands and wishes to proceed with the procedure. Written consent was obtained. Ultrasound was performed to localize and mark an adequate pocket of fluid in the right chest. The area was then prepped and draped in the normal sterile fashion. 1% Lidocaine was used for local anesthesia. Under ultrasound guidance a 6 Fr Safe-T-Centesis catheter was introduced. Thoracentesis was performed. The catheter was removed and a dressing applied. FINDINGS: A total of approximately 2 L of slightly hazy, amber colored fluid was removed. IMPRESSION: Successful ultrasound guided right thoracentesis yielding 2 L of pleural fluid. Read by: Shannon Dike PA-C Electronically Signed   By: Aletta Edouard M.D.   On: 09/18/2020 15:41  Scheduled Meds: . budesonide (PULMICORT) nebulizer solution  0.5 mg Nebulization BID  . Chlorhexidine Gluconate Cloth  6 each Topical Daily  . feeding supplement  237 mL Oral BID BM  . fluticasone  2 spray Each Nare Daily  . guaiFENesin  1,200 mg Oral BID  . ipratropium-albuterol  3 mL Nebulization TID  . loratadine  10 mg Oral Daily  . mouth rinse  15 mL Mouth Rinse BID  . pantoprazole  40 mg Oral Q0600   Continuous Infusions: . ceFEPime (MAXIPIME) IV Stopped (09/19/20 7078)  . dextrose 5 % and 0.9% NaCl Stopped (09/18/20 2126)  . vancomycin Stopped (09/19/20 0159)     LOS: 1 day    Time spent: [place]    Irine Seal, MD [facility]   To contact the attending provider between 7A-7P or the covering provider during after hours 7P-7A, please log into the web site [email or web address] and access using universal [place] password for that web site. If you do not have the password, please call the hospital operator.  09/19/2020, 9:34 AM

## 2020-09-19 NOTE — Progress Notes (Signed)
Initial Nutrition Assessment  DOCUMENTATION CODES:   Not applicable  INTERVENTION:  - will order Ensure Enlive po BID, each supplement provides 350 kcal and 20 grams of protein - will monitor for decisions made during family and patient's meeting with Palliative Care.   NUTRITION DIAGNOSIS:   Increased nutrient needs related to acute illness,chronic illness,cancer and cancer related treatments as evidenced by estimated needs.  GOAL:   Patient will meet greater than or equal to 90% of their needs  MONITOR:   PO intake,Supplement acceptance,Labs,Weight trends,Other (Comment) (GOC decisions)  REASON FOR ASSESSMENT:   Malnutrition Screening Tool  ASSESSMENT:   54 y.o. female with medical history of metastatic breast cancer and depression. She usually gets her care in [place] but has voluntarily stopped cancer-related treatment. She presented to the ED with progressive SOB and cough. She was found to have a large pleural effusion and met criteria for sepsis on admission.  No intakes documented since admission. Patient listed as a/o x3. Able to talk with RN before and after visit to patient's room. Palliative Care has been consulted and will see patient when feasible, possibly later today. Daughter is to arrive shortly.   Patient did not receive breakfast tray but was provided with Ensure by RN. This is in a cup at bedside. RN reports that patient has been intermittently confused or seems to go in and out of level of orientation.   Patient laying in bed with no family/visitors present. Patient is very tremulous. She has NRB in place. Patient requests sips of Ensure but is unable to hold cup; held cup for her. It takes her a great deal of effort to get Ensure through the straw and into her mouth.   Weight yesterday was 150 lb and PTA the only other weight in the chart is from 9/26 when she weighed 158 lb. This indicates 8 lb weight loss (5% body weight) in the past 3 months; not  significant for time frame.    Labs reviewed; creatinine: 1.21 mg/dl, Alk Phos and AST elevated, GFR: 53 ml/min.  Medications reviewed; 20 mg IV lasix BID. IVF; D5-NS @ 30 ml/hr (122 kcal).    NUTRITION - FOCUSED PHYSICAL EXAM:  completed; no muscle or fat depletions, mild edema to all extremities.   Diet Order:   Diet Order            Diet regular Room service appropriate? Yes; Fluid consistency: Thin  Diet effective now                 EDUCATION NEEDS:   Not appropriate for education at this time  Skin:  Skin Assessment: Reviewed RN Assessment  Last BM:  PTA/unknown  Height:   Ht Readings from Last 1 Encounters:  09/18/20 5' 7"  (1.702 m)    Weight:   Wt Readings from Last 1 Encounters:  09/18/20 68 kg    Estimated Nutritional Needs:  Kcal:  3149-7026 kcal Protein:  110-125 grams Fluid:  >/= 2.5 L/day       Jarome Matin, MS, RD, LDN, CNSC Inpatient Clinical Dietitian RD pager # available in [facility]  After hours/weekend pager # available in [facility]

## 2020-09-19 NOTE — Consult Note (Addendum)
NAME:  Shannon Garcia, MRN:  [id number], DOB:  [birth year], LOS: 1 ADMISSION DATE:  09/17/2020, CONSULTATION DATE:  12/20 REFERRING MD:  [facility]/ Dr Grandville Silos , CHIEF COMPLAINT:  Sob    Brief History:  36 yowf smoker/ unvaccinated with widely met breast ca admitted with progressive sob and found to have large R effusion and 2 liters removed by IR 12/19 with initially good aeration but worse sob  am 12/20 and new bilateral as dz on cxr so  PCCM consultation requested am 12/20  History of Present Illness:  54 y.o. female with medical history significant of metastatic breast cancer, depression, who normally gets her care in [place] and apparently has voluntarily stopped treatment coming in with progressive shortness of breath and cough. Patient was evaluated and found to have a large pleural effusion.  No fever or chills no nausea vomiting or diarrhea. Patient has not been vaccinated against the Covid. Patient is very anxious. Not forthcoming or some part of the history. Patient is still a full code who voiced her desire to be intubated if it comes to that. It appears she has possible pneumonia also on top of that. COVID-19 screen today is negative. She is also anemic and leukocytosis present. Patient is being admitted for further evaluation and treatment.  ED Course: Temperature 99.4 blood pressure 148/136 pulse 119 respiratory 30 oxygen sat 77% on room air. White count 18.7 CO2 17 BUN 18 creatinine 2.07 calcium 9.3 otherwise rest of the CBC and chemistry within normal. Chest x-ray showed large right-sided pleural effusion and possible infiltrates. COVID-19 screen is negative. D-dimer 9.10. Alkaline phosphatase 269 albumin 2.6 AST 183.  Review of Systems: As per HPI otherwise 10 point review of systems negative  Past Medical History:  Breast Ca metastatic to pleura and bone  And liver Depression  GERD HBP DM II     Significant Hospital Events:     Consults:  PCCM  Procedures:  R  Thoracentesis x 2 liters 12/20  Significant Diagnostic Tests:  CT chest 08/03/20 1. Continued interval progression of right-sided pleural metastases with new small right pleural effusion. 2. Similar appearance of left paraesophageal soft tissue lesion with similar appearance of mediastinal and right hilar lymphadenopathy. 3. Progression of hepatic metastases with new tiny hypodensities in the liver parenchyma suggesting new metastatic involvement in the liver. 4. Continued mild progression of upper abdominal and retroperitoneal lymphadenopathy. 5. Similar appearance of multiple mixed density thoracic spine and bone lesions consistent with metastatic involvement. 6. Small volume free fluid in the pelvis. 7. Aortic Atherosclerosis (ICD10-I70.0)  Micro Data:  Resp Viral panel  09/17/20 for flu/ covid 19 neg  BC x 2  12/19  >>> MRSA   PCR 12/19 >>> Urine legionella 12/20 >>>   Antimicrobials:  Levaquin 12/18 only Cefepime  12/18 >>> Vanc   12/18 >>>  Interim History / Subjective:  Today does not really appear interested in answering any questions and denies sob/ cp. Knows her PCP's name but none of the specialists treating her   Objective   Blood pressure 128/74, pulse 92, temperature 98.2 F (36.8 C), temperature source Oral, resp. rate (!) 22, height _0  (1.702 m), weight 68 kg, SpO2 96 %.    FiO2 (%):  [100 %] 100 %   Intake/Output Summary (Last 24 hours) at 09/19/2020 1339 Last data filed at 09/19/2020 1200 Gross per 24 hour  Intake 1237.44 ml  Output 800 ml  Net 437.44 ml   Filed Weights   09/18/20  0048  Weight: 68 kg    Examination:   Tmax  98.4  Pt appears terminally ill at this point,very frail withdrawn and unengaged in conversation  HENT: mucosa dry  Lungs: no wheeze/ bilateral insp crackles in bases / sats 92 % on  15 lpm HFNC  Cardiovascular: RRR  NSR  On monitor Abdomen: soft/ benign Extremities: trace edema/ warm Neuro: withdrawn, moving all 4/   Resting tremor hands   I personally reviewed images and agree with radiology impression as follows:  CXR:  12/20 Small right pleural effusion. Overall increase in airspace opacity bilaterally which may represent progression of multifocal pneumonia. A degree of underlying pulmonary edema cannot be excluded. Both edema and pneumonia may be present concurrently. Stable cardiac silhouette. Stable Port-A-Cath placement.    Resolved Hospital Problem list      Assessment & Plan:  1) acute hypoxemic resp failure worse since 2 liters thoracentesis 12/19 and c/w reexpansion pulmonary edema with ? underly met lung ca/ ? lymphangitic carcinomatosis vs pneumonia favored over chf though may have component - Note HCAP  also a possbility with PCT so high > approp rx on board per flowsheet - doubt COVID 19 19 though note not vaccinated and at risk    2) widely metastatic breast ca to bone, pleura, liver in pt who had apparently decided against more aggressive rx  - effusion already proven to be malignant per notes available in care everywhere. - ? Candidate for pleurex per IR if choses aggressive rx over a pure palliative approach.  3) protein calorie malnutrition with low alb may be contributing to peripheral edema and R effusion/ as dz   >> rx supplements as tol   4) smoking hx noted but no wheezing or other features of copd on exam  5) elevated D Dmer ? Etiology  - non specfic finding in pt with met ca   Best practice (evaluated daily)  Diet: per [facility] Pain/Anxiety/Delirium protocol (if indicated): per [facility] VAP protocol (if indicated): n/a DVT prophylaxis: per [facility]  GI prophylaxis: per [facility]  Glucose control: per [facility] Mobility: per [facility]  Disposition:consider home with hospice if continues to decline palliative chemo/RT options (assuming there is any  reasonable chance of response which may not be the case p discussing with daughter at bedside)   Clearly though  the high likelihood of  prolonging suffering from  PCCM  interventions vastly outweighs any reasonable chance of  longterm benefit  Here.  Therefore  I don't have any additional recs  except to consider hospice sooner rather than later - paradoxically many patients with respiratory diseases live longer and better once a palliative approach is used in this setting.      Labs   CBC: Recent Labs  Lab 09/17/20 2300 09/18/20 0618 09/19/20 0213  WBC 18.7* 12.7* 12.1*  NEUTROABS 16.3*  --   --   HGB 13.7 10.3* 12.4  HCT 42.1 31.5* 37.3  MCV 106.6* 106.8* 104.5*  PLT 153 99* 59*    Basic Metabolic Panel: Recent Labs  Lab 09/17/20 2300 09/18/20 0618 09/19/20 0808 09/19/20 1108  NA 138  --   --  138  K 4.8  --   --  4.7  CL 100  --   --  104  CO2 17*  --   --  25  GLUCOSE 86  --   --  93  BUN 18  --   --  35*  CREATININE 2.07* 1.21*  --  0.81  CALCIUM 9.3  --   --  9.3  MG  --   --  2.5*  --    GFR: Estimated Creatinine Clearance: 77.2 mL/min (by C-G formula based on SCr of 0.81 mg/dL). Recent Labs  Lab 09/17/20 0017 09/17/20 2300 09/18/20 0221 09/18/20 0618 09/19/20 0213 09/19/20 0808  PROCALCITON  --   --   --  14.01  --  15.72  WBC  --  18.7*  --  12.7* 12.1*  --   LATICACIDVEN 7.4*  --  5.8*  --   --  2.1*    Liver Function Tests: Recent Labs  Lab 09/17/20 2300 09/19/20 1108  AST 183* 115*  ALT 39 36  ALKPHOS 269* 234*  BILITOT 1.1 1.3*  PROT 7.2 7.3  ALBUMIN 2.6* 2.4*   No results for input(s): LIPASE, AMYLASE in the last 168 hours. No results for input(s): AMMONIA in the last 168 hours.  ABG    Component Value Date/Time   PHART 7.308 (L) 09/18/2020 0820   PCO2ART 44.4 09/18/2020 0820   PO2ART 58.8 (L) 09/18/2020 0820   HCO3 21.6 09/18/2020 0820   ACIDBASEDEF 4.1 (H) 09/18/2020 0820   O2SAT 86.2 09/18/2020 0820     Coagulation Profile: Recent Labs  Lab 09/18/20 0030 09/18/20 0618  INR 1.5* 2.0*    Cardiac Enzymes: No results for input(s): CKTOTAL, CKMB,  CKMBINDEX, TROPONINI in the last 168 hours.  HbA1C: No results found for: HGBA1C  CBG: No results for input(s): GLUCAP in the last 168 hours.     Past Medical History:  She,  has a past medical history of Cancer ([place]) and Depression.   Surgical History:  History reviewed. No pertinent surgical history.   Social History:   reports that she has been smoking. She has never used smokeless tobacco. She reports current alcohol use. She reports that she does not use drugs.   Family History:  Her family history is not on file.   Allergies Allergies  Allergen Reactions  . Wasp Venom Other (See Comments)    Unknown  . Azithromycin Rash     Home Medications  Prior to Admission medications   Medication Sig Start Date End Date Taking? Authorizing Provider  ALPRAZolam Duanne Moron) 1 MG tablet Take by mouth. 08/08/20   [provider]  amoxicillin (AMOXIL) 500 MG capsule Take 500 mg by mouth 3 (three) times daily. 09/09/20   [provider]  diphenoxylate-atropine (LOMOTIL) 2.5-0.025 MG tablet Take by mouth. 09/09/20   [provider]  HYDROcodone-acetaminophen (NORCO) 10-325 MG tablet TAKE ONE TABLET BY MOUTH EVERY 4 HOURS AS NEEDED FOR PAIN 08/29/20   Mosher, Vida Roller A, PA-C  lisinopril (ZESTRIL) 20 MG tablet Take 20 mg by mouth daily. 05/25/20   [provider]  methadone (DOLOPHINE) 10 MG tablet TAKE 1/2 TABLET EVERY 12 HOURS 09/16/20   Mosher, Vida Roller A, PA-C  pantoprazole (PROTONIX) 40 MG tablet Take 40 mg by mouth daily. 08/03/20   [provider]  potassium chloride SA (KLOR-CON) 20 MEQ tablet Take 20 mEq by mouth 3 (three) times daily. 09/07/20   [provider]  rOPINIRole (REQUIP) 0.5 MG tablet Take 0.5 mg by mouth at bedtime. 09/02/20   [provider]  sertraline (ZOLOFT) 100 MG tablet Take 100 mg by mouth 2 (two) times daily. 07/20/20   [provider]  VENTOLIN HFA 108 (90 Base) MCG/ACT inhaler Inhale into the lungs.  03/23/20   [provider]  Christinia Gully, MD Pulmonary and [place] [phone]   After 7:00 pm call Elink  [phone]

## 2020-09-19 NOTE — Consult Note (Signed)
Consultation Note Date: 09/19/2020   Patient Name: Shannon Garcia  DOB: 12-23-1965  MRN: [id number]  Age / Sex: 54 y.o., female  PCP: Welford Roche, NP Referring Physician: Eugenie Filler, MD  Reason for Consultation: Establishing goals of care and Psychosocial/spiritual support  HPI/Patient Profile: 54 y.o. female admitted on 09/17/2020 with past medical history significant of metastatic breast cancer/Dr Hinton Rao in [place]/made decision to stop treatment. depression/daughter reports suicide attempts in the past, chronic pain/home medications to includes Methadone  Admitted through the emergency room with progressive shortness of breath and cough.  Noted anemia and leukocytosis Prior to this admission she was living alone with support from family.  Noted continued physical and functional decline.  Patient was evaluated and found to have a large pleural effusion.  Patient has not been vaccinated against the Covid.      COVID-19 screen negative.  Patient admitted for further evaluation and treatment.  CT chest 08/03/20 1. Continued interval progression of right-sided pleural metastases with new small right pleural effusion. 2. Similar appearance of left paraesophageal soft tissue lesion with similar appearance of mediastinal and right hilar lymphadenopathy. 3. Progression of hepatic metastases with new tiny hypodensities in the liver parenchyma suggesting new metastatic involvement in the liver. 4. Continued mild progression of upper abdominal and retroperitoneal lymphadenopathy. 5. Similar appearance of multiple mixed density thoracic spine and bone lesions consistent with metastatic involvement. 6. Small volume free fluid in the pelvis. 7. Aortic Atherosclerosis (ICD10-I70.0)  Currently in ICU, lethargic and intermittently confused.  PMT consulted to clarify GOCs.   Patient and her family  face treatment option decisions, advanced directive decisions and anticipatory care needs.  Clinical Assessment and Goals of Care:  This NP Wadie Lessen reviewed medical records, received report from team, assessed the patient and then spoke to the  patient's daughter/ Colletta [place] by telephone discuss diagnosis, prognosis, GOC, EOL wishes disposition and options.  Patient is confused, does not have medical decision capacity at this time and unable to participate in today's conversation.   Concept of Palliative Care was introduced as specialized medical care for people and their families living with serious illness.  If focuses on providing relief from the symptoms and stress of a serious illness.  The goal is to improve quality of life for both the patient and the family.  Values and goals of care important to patient and family were attempted to be elicited.  Created space and opportunity for family  to explore thoughts and feelings regarding current medical situation.  Daughter verbalizes that she "did not realize how sick she was or that she had stopped treatment", patient was still living alone.  It is unclear  when patient last saw oncology.   Education offered today regarding advanced directives.  Concepts specific to code status was addressed.  Daughter reports the patient had a DNR/DNI previously and place, and wishes to implement that document today.    Recommended that daughter meet in person to further clarify GOCs, she agrees to meet with me tomorrow at  11:00 along with the patient's sister and aunt.   Questions and concerns addressed.  Family encouraged to call with questions or concerns.     PMT will continue to support holistically.   No documented HPOA or ACP documentation.    Daughter/[name] is patient's main support and decision maker at this time.     SUMMARY OF RECOMMENDATIONS    Code Status/Advance Care Planning:  DNR-documented today.  Daughter tells me patient  had a DNR in place and wishes to implement today.   Palliative Prophylaxis:   Aspiration, Delirium Protocol, Frequent Pain Assessment and Oral Care  Additional Recommendations (Limitations, Scope, Preferences):  Full Scope Treatment   Family meeting scheduled for tomorrow at 32 AM to include daughter and patient's 2 sisters; Heather Roberts and Enedina Finner for further clarification of goals of care.  Psycho-social/Spiritual:   Desire for further Chaplaincy support:no  Additional Recommendations: Grief/Bereavement Support  Prognosis:   Unable to determine  Discharge Planning: To Be Determined      Primary Diagnoses: Present on Admission: . Cancer, metastatic to bone ([place]) . Malignant neoplasm of upper-outer quadrant of right female breast ([place]) . Leucocytosis . Sepsis ([place]) . Acute on chronic respiratory failure with hypoxemia ([place]) . Acute respiratory failure with hypoxia ([place])   I have reviewed the medical record, interviewed the patient and family, and examined the patient. The following aspects are pertinent.  Past Medical History:  Diagnosis Date  . Cancer ([place])   . Depression    Social History   Socioeconomic History  . Marital status: Single    Spouse name: Not on file  . Number of children: Not on file  . Years of education: Not on file  . Highest education level: Not on file  Occupational History  . Not on file  Tobacco Use  . Smoking status: Current Every Day Smoker  . Smokeless tobacco: Never Used  Substance and Sexual Activity  . Alcohol use: Yes  . Drug use: Never  . Sexual activity: Not Currently  Other Topics Concern  . Not on file  Social History Narrative  . Not on file   Social Determinants of Health   Financial Resource Strain: Not on file  Food Insecurity: Not on file  Transportation Needs: Not on file  Physical Activity: Not on file  Stress: Not on file  Social Connections: Not on file   History reviewed. No pertinent family  history. Scheduled Meds: . budesonide (PULMICORT) nebulizer solution  0.5 mg Nebulization BID  . busPIRone  5 mg Oral BID  . Chlorhexidine Gluconate Cloth  6 each Topical Daily  . feeding supplement  237 mL Oral BID BM  . fluticasone  2 spray Each Nare Daily  . furosemide  20 mg Intravenous Q12H  . guaiFENesin  1,200 mg Oral BID  . ipratropium-albuterol  3 mL Nebulization TID  . loratadine  10 mg Oral Daily  . mouth rinse  15 mL Mouth Rinse BID  . pantoprazole  40 mg Oral Q0600  . rOPINIRole  0.5 mg Oral QHS  . sertraline  100 mg Oral BID   Continuous Infusions: . ceFEPime (MAXIPIME) IV Stopped (09/19/20 0938)  . dextrose 5 % and 0.9% NaCl 30 mL/hr at 09/19/20 0700  . vancomycin Stopped (09/19/20 0159)   PRN Meds:.acetaminophen **OR** acetaminophen, LORazepam, ondansetron **OR** ondansetron (ZOFRAN) IV, sodium chloride flush Medications Prior to Admission:  Prior to Admission medications   Medication Sig Start Date End Date Taking? Authorizing Provider  ALPRAZolam Duanne Moron) 1  MG tablet Take by mouth. 08/08/20   [provider]  amoxicillin (AMOXIL) 500 MG capsule Take 500 mg by mouth 3 (three) times daily. 09/09/20   [provider]  diphenoxylate-atropine (LOMOTIL) 2.5-0.025 MG tablet Take by mouth. 09/09/20   [provider]  HYDROcodone-acetaminophen (NORCO) 10-325 MG tablet TAKE ONE TABLET BY MOUTH EVERY 4 HOURS AS NEEDED FOR PAIN 08/29/20   Mosher, Vida Roller A, PA-C  lisinopril (ZESTRIL) 20 MG tablet Take 20 mg by mouth daily. 05/25/20   [provider]  methadone (DOLOPHINE) 10 MG tablet TAKE 1/2 TABLET EVERY 12 HOURS 09/16/20   Mosher, Vida Roller A, PA-C  pantoprazole (PROTONIX) 40 MG tablet Take 40 mg by mouth daily. 08/03/20   [provider]  potassium chloride SA (KLOR-CON) 20 MEQ tablet Take 20 mEq by mouth 3 (three) times daily. 09/07/20   [provider]  rOPINIRole (REQUIP) 0.5 MG tablet Take 0.5 mg by mouth at bedtime. 09/02/20    [provider]  sertraline (ZOLOFT) 100 MG tablet Take 100 mg by mouth 2 (two) times daily. 07/20/20   [provider]  VENTOLIN HFA 108 (90 Base) MCG/ACT inhaler Inhale into the lungs. 03/23/20   [provider]   Allergies  Allergen Reactions  . Wasp Venom Other (See Comments)    Unknown  . Azithromycin Rash   Review of Systems  Unable to perform ROS: Mental status change    Physical Exam Constitutional:      Appearance: She is underweight.  HENT:     Head:     Comments: -noted temporal muscle wasting Cardiovascular:     Rate and Rhythm: Normal rate.  Musculoskeletal:     Comments: Weakness and muscle atrophy  Skin:    General: Skin is warm and dry.  Neurological:     Mental Status: She is lethargic.     Vital Signs: BP 124/78   Pulse 93   Temp (!) 97.3 F (36.3 C) (Oral)   Resp (!) 21   Ht 5\' 7"  (1.702 m)   Wt 68 kg   SpO2 97%   BMI 23.49 kg/m  Pain Scale: Faces   Pain Score: 0-No pain   SpO2: SpO2: 97 % O2 Device:SpO2: 97 % O2 Flow Rate: .O2 Flow Rate (L/min): 30 L/min  IO: Intake/output summary:   Intake/Output Summary (Last 24 hours) at 09/19/2020 1123 Last data filed at 09/19/2020 1100 Gross per 24 hour  Intake 939.51 ml  Output 800 ml  Net 139.51 ml    LBM: Last BM Date:  (pta) Baseline Weight: Weight: 68 kg Most recent weight: Weight: 68 kg     Palliative Assessment/Data: 30 % at best   Discussed with bedside RN  Time In: 1400 Time Out: 1510 Time Total: 70 minutes Greater than 50%  of this time was spent counseling and coordinating care related to the above assessment and plan.  Signed by: Wadie Lessen, NP   Please contact Palliative Medicine Team phone at [phone] for questions and concerns.  For individual provider: See Shea Evans

## 2020-09-19 NOTE — Progress Notes (Signed)
Pharmacy Antibiotic Note  Shannon Garcia is a 54 y.o. female admitted on 09/17/2020 with SOB.  Pt is a breast cancer pt that has declined any further treatment for her cancer. Pharmacy has been consulted for cefepime and vancomycin dosing for pna.  09/19/20 12:50 PM   SCr much improved today  MRSA PCR sent  PCT elevated, increased slightly  Afebrile  WBC elevated (Did receive 125 mg of methylprednisolone on 12/18  Plan: With improvement in SCr, will increase antibiotics dosing Will increase cefepime to 2 g iv q 8 hours  Will increase vancomycin to 1750 mg iv q 24 hours Follow renal function cultures and clinical course  Height: 5\' 7"  (170.2 cm) Weight: 68 kg (150 lb) IBW/kg (Calculated) : 61.6  Temp (24hrs), Avg:97.8 F (36.6 C), Min:97.3 F (36.3 C), Max:98.4 F (36.9 C)  Recent Labs  Lab 09/17/20 0017 09/17/20 2300 09/18/20 0221 09/18/20 0618 09/19/20 0213 09/19/20 0808 09/19/20 1108  WBC  --  18.7*  --  12.7* 12.1*  --   --   CREATININE  --  2.07*  --  1.21*  --   --  0.81  LATICACIDVEN 7.4*  --  5.8*  --   --  2.1*  --     Estimated Creatinine Clearance: 77.2 mL/min (by C-G formula based on SCr of 0.81 mg/dL).    Allergies  Allergen Reactions  . Wasp Venom Other (See Comments)    Unknown  . Azithromycin Rash     Thank you for allowing pharmacy to be a part of this patient's care.  Ulice Dash D  09/19/2020, 12:49 PM

## 2020-09-19 NOTE — Progress Notes (Signed)
RT try to get a ABG and the Pt refused unless I used a butterfly needle. Rt's dont have butterfly needles. Pt's SATS were 93-95%. The heated high [place] keeps coming out of the Pt's nose. RT will continue to monitor

## 2020-09-20 ENCOUNTER — Encounter (HOSPITAL_COMMUNITY): Payer: Self-pay | Admitting: Certified Registered Nurse Anesthetist

## 2020-09-20 ENCOUNTER — Inpatient Hospital Stay (HOSPITAL_COMMUNITY): Payer: Medicare Other

## 2020-09-20 DIAGNOSIS — Z7189 Other specified counseling: Secondary | ICD-10-CM

## 2020-09-20 DIAGNOSIS — R531 Weakness: Secondary | ICD-10-CM

## 2020-09-20 DIAGNOSIS — Z515 Encounter for palliative care: Secondary | ICD-10-CM

## 2020-09-20 DIAGNOSIS — J90 Pleural effusion, not elsewhere classified: Secondary | ICD-10-CM

## 2020-09-20 DIAGNOSIS — J189 Pneumonia, unspecified organism: Secondary | ICD-10-CM

## 2020-09-20 DIAGNOSIS — C50411 Malignant neoplasm of upper-outer quadrant of right female breast: Secondary | ICD-10-CM

## 2020-09-20 LAB — CBC WITH DIFFERENTIAL/PLATELET
Abs Immature Granulocytes: 0.13 10*3/uL — ABNORMAL HIGH (ref 0.00–0.07)
Basophils Absolute: 0 10*3/uL (ref 0.0–0.1)
Basophils Relative: 0 %
Eosinophils Absolute: 0.1 10*3/uL (ref 0.0–0.5)
Eosinophils Relative: 1 %
HCT: 37.8 % (ref 36.0–46.0)
Hemoglobin: 12.9 g/dL (ref 12.0–15.0)
Immature Granulocytes: 2 %
Lymphocytes Relative: 12 %
Lymphs Abs: 0.9 10*3/uL (ref 0.7–4.0)
MCH: 34.8 pg — ABNORMAL HIGH (ref 26.0–34.0)
MCHC: 34.1 g/dL (ref 30.0–36.0)
MCV: 101.9 fL — ABNORMAL HIGH (ref 80.0–100.0)
Monocytes Absolute: 0.3 10*3/uL (ref 0.1–1.0)
Monocytes Relative: 3 %
Neutro Abs: 6.4 10*3/uL (ref 1.7–7.7)
Neutrophils Relative %: 82 %
Platelets: 37 10*3/uL — ABNORMAL LOW (ref 150–400)
RBC: 3.71 MIL/uL — ABNORMAL LOW (ref 3.87–5.11)
RDW: 17.7 % — ABNORMAL HIGH (ref 11.5–15.5)
WBC: 7.9 10*3/uL (ref 4.0–10.5)
nRBC: 0 % (ref 0.0–0.2)

## 2020-09-20 LAB — URINE CULTURE: Culture: <10000 — AB

## 2020-09-20 LAB — COMPREHENSIVE METABOLIC PANEL
ALT: 29 U/L (ref 0–44)
AST: 90 U/L — ABNORMAL HIGH (ref 15–41)
Albumin: 2.1 g/dL — ABNORMAL LOW (ref 3.5–5.0)
Alkaline Phosphatase: 195 U/L — ABNORMAL HIGH (ref 38–126)
Anion gap: 7 (ref 5–15)
BUN: 32 mg/dL — ABNORMAL HIGH (ref 6–20)
CO2: 28 mmol/L (ref 22–32)
Calcium: 9.3 mg/dL (ref 8.9–10.3)
Chloride: 104 mmol/L (ref 98–111)
Creatinine, Ser: 0.76 mg/dL (ref 0.44–1.00)
GFR, Estimated: >60 mL/min (ref >60)
Glucose, Bld: 90 mg/dL (ref 70–99)
Potassium: 4.6 mmol/L (ref 3.5–5.1)
Sodium: 139 mmol/L (ref 135–145)
Total Bilirubin: 1.2 mg/dL (ref 0.3–1.2)
Total Protein: 6.1 g/dL — ABNORMAL LOW (ref 6.5–8.1)

## 2020-09-20 LAB — PROCALCITONIN: Procalcitonin: 10.53 ng/mL

## 2020-09-20 LAB — BRAIN NATRIURETIC PEPTIDE: B Natriuretic Peptide: 928.1 pg/mL — ABNORMAL HIGH (ref 0.0–100.0)

## 2020-09-20 LAB — LEGIONELLA PNEUMOPHILA SEROGP 1 UR AG: L. pneumophila Serogp 1 Ur Ag: NEGATIVE

## 2020-09-20 LAB — LACTIC ACID, PLASMA: Lactic Acid, Venous: 3.1 mmol/L (ref 0.5–1.9)

## 2020-09-20 MED ORDER — FUROSEMIDE 10 MG/ML IJ SOLN
20.0000 mg | Freq: Once | INTRAMUSCULAR | Status: AC
  Administered 2020-09-20: 21:00:00 20 mg via INTRAVENOUS
  Filled 2020-09-20: qty 2

## 2020-09-20 MED ORDER — FUROSEMIDE 10 MG/ML IJ SOLN
40.0000 mg | Freq: Two times a day (BID) | INTRAMUSCULAR | Status: DC
  Administered 2020-09-21 – 2020-09-23 (×6): 40 mg via INTRAVENOUS
  Filled 2020-09-20 (×6): qty 4

## 2020-09-20 MED ORDER — SODIUM CHLORIDE 0.9 % IV SOLN
INTRAVENOUS | Status: DC | PRN
  Administered 2020-09-20: 15:00:00 250 mL via INTRAVENOUS

## 2020-09-20 MED ORDER — GUAIFENESIN 100 MG/5ML PO SOLN
15.0000 mL | Freq: Four times a day (QID) | ORAL | Status: DC | PRN
  Administered 2020-09-21: 23:00:00 150 mg via ORAL
  Filled 2020-09-20: qty 20

## 2020-09-20 NOTE — Progress Notes (Signed)
NAME:  Shannon Garcia, MRN:  [id number], DOB:  08/09/1966, LOS: 2 ADMISSION DATE:  09/17/2020, CONSULTATION DATE:  12/20 REFERRING MD:  [facility]/ Dr Grandville Silos , CHIEF COMPLAINT:  Sob    Brief History:  70 yowf smoker/ unvaccinated with widely met breast ca admitted with progressive sob and found to have large R effusion and 2 liters removed by IR 12/19 with initially good aeration but worse sob  am 12/20 and new bilateral as dz on cxr so  PCCM consultation requested am 12/20  History of Present Illness:  54 y.o. female with medical history significant of metastatic breast cancer, depression, who normally gets her care in [place] and apparently has voluntarily stopped treatment coming in with progressive shortness of breath and cough. Patient was evaluated and found to have a large pleural effusion.  No fever or chills no nausea vomiting or diarrhea. Patient has not been vaccinated against the Covid. Patient is very anxious. Not forthcoming or some part of the history. Patient is still a full code who voiced her desire to be intubated if it comes to that. It appears she has possible pneumonia also on top of that. COVID-19 screen today is negative. She is also anemic and leukocytosis present. Patient is being admitted for further evaluation and treatment.  ED Course: Temperature 99.4 blood pressure 148/136 pulse 119 respiratory 30 oxygen sat 77% on room air. White count 18.7 CO2 17 BUN 18 creatinine 2.07 calcium 9.3 otherwise rest of the CBC and chemistry within normal. Chest x-ray showed large right-sided pleural effusion and possible infiltrates. COVID-19 screen is negative. D-dimer 9.10. Alkaline phosphatase 269 albumin 2.6 AST 183.  Review of Systems: As per HPI otherwise 10 point review of systems negative  Past Medical History:  Breast Ca metastatic to pleura and bone  And liver f/u in [place], refusing further rx Depression  GERD HBP DM II     Significant Hospital Events:      Consults:  PCCM  Procedures:  R Thoracentesis x 2 liters 12/20  Significant Diagnostic Tests:  CT chest 08/03/20 1. Continued interval progression of right-sided pleural metastases with new small right pleural effusion. 2. Similar appearance of left paraesophageal soft tissue lesion with similar appearance of mediastinal and right hilar lymphadenopathy. 3. Progression of hepatic metastases with new tiny hypodensities in the liver parenchyma suggesting new metastatic involvement in the liver. 4. Continued mild progression of upper abdominal and retroperitoneal lymphadenopathy. 5. Similar appearance of multiple mixed density thoracic spine and bone lesions consistent with metastatic involvement. 6. Small volume free fluid in the pelvis. 7. Aortic Atherosclerosis (ICD10-I70.0)  Micro Data:  Resp Viral panel  09/17/20 for flu/ covid 19 neg  BC x 2  12/19  >>> MRSA   PCR 12/19 >>> Urine legionella 12/20 >>> Urine strep  12/20 > neg  Urine culture 12/20 > no significant growth    Antimicrobials:  Levaquin 12/18 only Cefepime  12/18 >>> Vanc   12/18 >>>  Scheduled Meds: . budesonide (PULMICORT) nebulizer solution  0.5 mg Nebulization BID  . busPIRone  5 mg Oral BID  . Chlorhexidine Gluconate Cloth  6 each Topical Daily  . feeding supplement  237 mL Oral BID BM  . fluticasone  2 spray Each Nare Daily  . furosemide  20 mg Intravenous Q12H  . guaiFENesin  1,200 mg Oral BID  . ipratropium-albuterol  3 mL Nebulization TID  . loratadine  10 mg Oral Daily  . mouth rinse  15 mL Mouth Rinse BID  .  pantoprazole  40 mg Oral Q0600  . rOPINIRole  0.5 mg Oral QHS  . sertraline  100 mg Oral BID   Continuous Infusions: . ceFEPime (MAXIPIME) IV Stopped (09/20/20 0547)  . vancomycin Stopped (09/19/20 2301)   PRN Meds:.acetaminophen **OR** acetaminophen, LORazepam, ondansetron **OR** ondansetron (ZOFRAN) IV, sodium chloride flush    Interim History / Subjective:  A bit more  animated today, not wanting any additional studies done  Objective   Blood pressure 126/77, pulse 90, temperature 98.3 F (36.8 C), temperature source Axillary, resp. rate (!) 23, height 5' 7"  (1.702 m), weight 71 kg, SpO2 93 %.    FiO2 (%):  [100 %] 100 %   Intake/Output Summary (Last 24 hours) at 09/20/2020 0921 Last data filed at 09/20/2020 0200 Gross per 24 hour  Intake 510.45 ml  Output 1750 ml  Net -1239.55 ml   Filed Weights   09/18/20 0048 09/20/20 0500  Weight: 68 kg 71 kg    Examination:  Tmax    Pt more chronically than acutely ill appearing/ sats 98% on HFNC No jvd Oropharynx clear,  mucosa nl Neck supple Lungs with a few scattered exp > insp rhonchi bilaterally decreased bs R vs L base  RRR no s3 or or sign murmur Abd soft / nl excursion  Extr warm with no edema or clubbing noted Neuro  Sensorium variably appropriate ,  no apparent motor deficits     I personally reviewed images and agree with radiology impression as follows:  CXR:   Portable 12/21 1. Diffuse bilateral pulmonary infiltrates/edema again noted without interim change. Right pleural effusion again noted with slight from prior exam. 2. Right mastectomy. Bony lesions suggesting metastatic disease best identified on prior CT.       Resolved Hospital Problem list      Assessment & Plan:  1) acute hypoxemic resp failure worse since 2 liters thoracentesis 12/19 and c/w reexpansion pulmonary edema with ? underly met lung ca/ ? lymphangitic carcinomatosis vs pneumonia favored over chf though may have component of this as well with bnp in the intermediate range - Note HCAP  also a possbility with PCT so high > approp rx on board per flowsheet - doubt COVID 19 19 though note not vaccinated and at risk    2) widely metastatic breast ca to bone, pleura, liver in pt who had apparently decided against more aggressive rx  - effusion already proven to be malignant per notes available in care  everywhere. - ? Candidate for pleurex per IR if choses aggressive rx over a pure palliative approach but I gather the plan is for the latter based on her refusal of other rx   3) protein calorie malnutrition with low alb may be contributing to peripheral edema and R effusion/ as dz   >> rx supplements as tol   4) smoking hx noted but no wheezing or other features of copd on exam  5) elevated D Dmer ? Etiology  - non specfic finding in pt with met ca   Best practice (evaluated daily)  Diet: per [facility] Pain/Anxiety/Delirium protocol (if indicated): per [facility] VAP protocol (if indicated): n/a DVT prophylaxis: per [facility]  GI prophylaxis: per [facility]  Glucose control: per [facility] Mobility: per [facility]  Disposition:consider home with hospice if continues to decline palliative chemo/RT options (assuming there is any  reasonable chance of response which may not be the case p discussing with daughter at bedside)   Clearly though  the high likelihood of prolonging suffering  from  PCCM  interventions vastly outweighs any reasonable chance of  longterm benefit  Here.  Therefore  I don't have any additional recs  except to consider hospice sooner rather than later - paradoxically many patients with respiratory diseases live longer and better once a palliative approach is used in this setting.     >>> pulmonary f/u can be as needed  >>> would transfer to floor / home on hospice?   Christinia Gully, MD [facility] [phone]   After 7:00 pm call Elink  [phone]     Labs   CBC: Recent Labs  Lab 09/17/20 2300 09/18/20 0618 09/19/20 0213  WBC 18.7* 12.7* 12.1*  NEUTROABS 16.3*  --   --   HGB 13.7 10.3* 12.4  HCT 42.1 31.5* 37.3  MCV 106.6* 106.8* 104.5*  PLT 153 99* 59*    Basic Metabolic Panel: Recent Labs  Lab 09/17/20 2300 09/18/20 0618 09/19/20 0808 09/19/20 1108  NA 138  --   --  138  K 4.8  --   --  4.7  CL 100  --   --  104   CO2 17*  --   --  25  GLUCOSE 86  --   --  93  BUN 18  --   --  35*  CREATININE 2.07* 1.21*  --  0.81  CALCIUM 9.3  --   --  9.3  MG  --   --  2.5*  --    GFR: Estimated Creatinine Clearance: 77.2 mL/min (by C-G formula based on SCr of 0.81 mg/dL). Recent Labs  Lab 09/17/20 0017 09/17/20 2300 09/18/20 0221 09/18/20 0618 09/19/20 0213 09/19/20 0808  PROCALCITON  --   --   --  14.01  --  15.72  WBC  --  18.7*  --  12.7* 12.1*  --   LATICACIDVEN 7.4*  --  5.8*  --   --  2.1*    Liver Function Tests: Recent Labs  Lab 09/17/20 2300 09/19/20 1108  AST 183* 115*  ALT 39 36  ALKPHOS 269* 234*  BILITOT 1.1 1.3*  PROT 7.2 7.3  ALBUMIN 2.6* 2.4*   No results for input(s): LIPASE, AMYLASE in the last 168 hours. No results for input(s): AMMONIA in the last 168 hours.  ABG    Component Value Date/Time   PHART 7.308 (L) 09/18/2020 0820   PCO2ART 44.4 09/18/2020 0820   PO2ART 58.8 (L) 09/18/2020 0820   HCO3 21.6 09/18/2020 0820   ACIDBASEDEF 4.1 (H) 09/18/2020 0820   O2SAT 86.2 09/18/2020 0820     Coagulation Profile: Recent Labs  Lab 09/18/20 0030 09/18/20 0618  INR 1.5* 2.0*    Cardiac Enzymes: No results for input(s): CKTOTAL, CKMB, CKMBINDEX, TROPONINI in the last 168 hours.  HbA1C: No results found for: HGBA1C  CBG: No results for input(s): GLUCAP in the last 168 hours.

## 2020-09-20 NOTE — Progress Notes (Signed)
Daily Progress Note   Patient Name: Shannon Garcia       Date: 09/20/2020 DOB: 1965/11/05  Age: 54 y.o. MRN#: [id number] Attending Physician: Eugenie Filler, MD Primary Care Physician: Welford Roche, NP Admit Date: 09/17/2020  Reason for Consultation/Follow-up: Establishing goals of care  Subjective:  resting in bed, high O2 requirements at 15 L [place], was not on home O2 prior to this hospitalization. Awakens some, confused at times. Appears pale and weak.   Patient seen and examined, discussed with family after they arrived.   Length of Stay: 2  Current Medications: Scheduled Meds:  . budesonide (PULMICORT) nebulizer solution  0.5 mg Nebulization BID  . busPIRone  5 mg Oral BID  . Chlorhexidine Gluconate Cloth  6 each Topical Daily  . feeding supplement  237 mL Oral BID BM  . fluticasone  2 spray Each Nare Daily  . furosemide  40 mg Intravenous Q12H  . guaiFENesin  1,200 mg Oral BID  . ipratropium-albuterol  3 mL Nebulization TID  . loratadine  10 mg Oral Daily  . mouth rinse  15 mL Mouth Rinse BID  . pantoprazole  40 mg Oral Q0600  . rOPINIRole  0.5 mg Oral QHS  . sertraline  100 mg Oral BID    Continuous Infusions: . ceFEPime (MAXIPIME) IV Stopped (09/20/20 0547)  . vancomycin Stopped (09/19/20 2301)    PRN Meds: acetaminophen **OR** acetaminophen, LORazepam, ondansetron **OR** ondansetron (ZOFRAN) IV, sodium chloride flush  Physical Exam          Vital Signs: BP 126/77   Pulse 90   Temp 98.7 F (37.1 C) (Axillary)   Resp (!) 23   Ht 5\' 7"  (1.702 m)   Wt 71 kg   LMP  (LMP Unknown)   SpO2 93%   BMI 24.52 kg/m  SpO2: SpO2: 93 % O2 Device: O2 Device: Nasal Cannula (SALTER) O2 Flow Rate: O2 Flow Rate (L/min): 15 L/min  Intake/output summary:    Intake/Output Summary (Last 24 hours) at 09/20/2020 1234 Last data filed at 09/20/2020 0200 Gross per 24 hour  Intake 270.5 ml  Output 1200 ml  Net -929.5 ml   LBM: Last BM Date:  (pta) Baseline Weight: Weight: 68 kg Most recent weight: Weight: 71 kg       Palliative Assessment/Data:  Patient Active Problem List   Diagnosis Date Noted  . Community acquired pneumonia of right lung   . Pleural effusion, right 09/18/2020  . Leucocytosis 09/18/2020  . Sepsis ([place]) 09/18/2020  . Acute on chronic respiratory failure with hypoxemia ([place]) 09/18/2020  . Acute respiratory failure with hypoxia ([place]) 09/18/2020  . Anemia, vitamin B12 deficiency 06/26/2020  . Cancer, metastatic to bone ([place]) 06/26/2020  . Malignant neoplasm of upper-outer quadrant of right female breast ([place]) 06/19/2020    Palliative Care Assessment & Plan   Patient Profile:  54 y.o. female admitted on 09/17/2020 with past medical history significant ofmetastatic breast cancer/Dr Hinton Rao in [place]/made decision to stop treatment. depression/daughter reports suicide attempts in the past, chronic pain/home medications to includes Methadone  Admitted through the emergency room with progressive shortness of breath and cough.  Noted anemia and leukocytosis Prior to this admission she was living alone with support from family.  Noted continued physical and functional decline.  Patient was evaluated and found to have a large pleural effusion.  Patient has not been vaccinated against the Covid.      COVID-19 screen negative.  Patient admitted for further evaluation and treatment.  Assessment:  Acute on chronic respiratory failure with hypoxemia likely secondary to large right-sided pleural effusion/?  Parapneumonic effusion and pneumonia/?  Pulmonary edema/volume overload. Chronic pain syndrome.    Recommendations/Plan:   goals of care family meeting with patient, daughter and sister was held, we reviewed about  the patient's current condition, goals and wishes.   Plan: time limited trial of current interventions for the next 48 hours or so. Likely for home with hospice after that.  PMT to follow.    Code Status:    Code Status Orders  (From admission, onward)         Start     Ordered   09/19/20 1508  Do not attempt resuscitation (DNR)  Continuous       Question Answer Comment  In the event of cardiac or respiratory ARREST Do not call a "code blue"   In the event of cardiac or respiratory ARREST Do not perform Intubation, CPR, defibrillation or ACLS   In the event of cardiac or respiratory ARREST Use medication by any route, position, wound care, and other measures to relive pain and suffering. May use oxygen, suction and manual treatment of airway obstruction as needed for comfort.      09/19/20 1507        Code Status History    Date Active Date Inactive Code Status Order ID Comments User Context   09/18/2020 0403 09/19/2020 1507 Full Code [id number]  Elwyn Reach, MD ED   Advance Care Planning Activity       Prognosis:   guarded.   Discharge Planning:  To Be Determined Likely for home with hospice.   Care plan was discussed with patient, [facility] MD, RN, patient,daughter and sister.    Thank you for allowing the Palliative Medicine Team to assist in the care of this patient.   Time In: 11 Time Out: 11.45 Total Time 45 Prolonged Time Billed  no       Greater than 50%  of this time was spent counseling and coordinating care related to the above assessment and plan.  Loistine Chance, MD  Please contact Palliative Medicine Team phone at [phone] for questions and concerns.

## 2020-09-20 NOTE — Progress Notes (Signed)
PROGRESS NOTE    Shannon Garcia  [id number] DOB: Nov 20, 1965 DOA: 09/17/2020 PCP: Welford Roche, NP    Chief Complaint  Patient presents with  . Shortness of Breath    Brief Narrative:  HPI per Dr. Lloyd Huger Shannon Garcia is a 54 y.o. female with medical history significant of metastatic breast cancer, depression, who normally gets her care in [place] and apparently has voluntarily stopped treatment coming in with progressive shortness of breath and cough. Patient was evaluated and found to have a large pleural effusion. She also admits sepsis criteria. No fever or chills no nausea vomiting or diarrhea. Patient has not been vaccinated against the Covid. Patient is very anxious. Not forthcoming or some part of the history. Patient is still a full code who voiced her desire to be intubated if it comes to that. It appears she has possible pneumonia also on top of that. COVID-19 screen today is negative. She is also anemic and leukocytosis present. Patient is being admitted for further evaluation and treatment.  ED Course: Temperature 99.4 blood pressure 148/136 pulse 119 respiratory 30 oxygen sat 77% on room air. White count 18.7 CO2 17 BUN 18 creatinine 2.07 calcium 9.3 otherwise rest of the CBC and chemistry within normal. Chest x-ray showed large right-sided pleural effusion and possible infiltrates. COVID-19 screen is negative. D-dimer 9.10. Alkaline phosphatase 269 albumin 2.6 AST 183.  Assessment & Plan:   Principal Problem:   Acute on chronic respiratory failure with hypoxemia (HCC) Active Problems:   Malignant neoplasm of upper-outer quadrant of right female breast (HCC)   Cancer, metastatic to bone (HCC)   Pleural effusion, right   Leucocytosis   Sepsis (HCC)   Acute respiratory failure with hypoxia ([place])   Community acquired pneumonia of right lung  1 acute on chronic respiratory failure with hypoxemia likely secondary to large right-sided pleural effusion/?   Parapneumonic effusion and pneumonia/?  Pulmonary edema/volume overload Patient presented with acute on chronic respiratory failure with significant hypoxia currently on 15 L high flow nasal cannula.  Patient noted to have sats of 77% on room air on admission.  Patient noted to have a leukocytosis of 18.7, progressive worsening shortness of breath and cough.  Patient status post therapeutic ultrasound-guided paracentesis with 2 L of hazy amber-colored fluid removed 09/18/2020.  Unfortunately labs were not sent.  COVID-19 PCR negative.  Influenza A and B PCR negative.  Blood cultures pending with no growth to date.  Sputum Gram stain and cultures pending.  Urine strep pneumococcus antigen negative.  Urine Legionella antigen pending.  Repeat chest x-ray this morning unchanged from yesterday with small right pleural effusion, diffuse bilateral pulmonary infiltrates/edema again noted without interim change.  Right mastectomy.  Bony lesions suggesting metastatic disease best identified on prior CT.  Patient with a urine output of 2 L over the past 24 hours after being placed on Lasix 20 mg every 12 hours.  Increase Lasix to 40 mg IV every 12 hours.  Labs pending for this morning.  Added BNP.  Continue IV cefepime, IV vancomycin, Pulmicort, scheduled duo nebs, Flonase, Mucinex, Claritin, PPI.  PCCM consulted and recommending consideration for shifting to comfort focus with home with hospice if continued declining of palliative chemotherapy/radiation treatment options.  Palliative care consulted and following.   2.  Sepsis secondary to pneumonia Patient presenting with criteria meeting for sepsis including acute on chronic hypoxic respiratory failure, large right-sided pleural effusion, noted to have a pneumonia on imaging.  Patient also noted to have  a leukocytosis on admission, tachycardic, tachypneic with hypoxia.  Patient also noted to have a elevated lactic acid level of 5.8, elevated procalcitonin level.  Blood  cultures pending.  Ultrasound-guided thoracentesis done and labs unfortunately were not sent.  Sputum Gram stain and cultures pending.  Urine cultures with insignificant growth.  Currently afebrile.  Leukocytosis trending down.  Continue empiric IV vancomycin and IV cefepime.  Follow.   3.  Widely metastatic malignant breast cancer Patient stated has had breast cancer for approximately 11 years.  Status post right mastectomy.  Patient noted to have quit treatment voluntarily.  Palliative care consulted and palliative care to meet with family later on this morning for goals of care discussion.   4.  Leukocytosis Secondary to problems #1 and 2.  Blood cultures pending with no growth to date.  Urine cultures with insignificant growth.  Sputum Gram stain and cultures pending.  Leukocytosis trending down.  Continue empiric IV antibiotics.  Follow.  5.  Gastroesophageal reflux disease Continue PPI daily.   6.  Depression/anxiety Continue home regimen Zoloft.  Continue scheduled BuSpar twice daily.  Continue IV Ativan as needed.  Follow.  7.  Hypertension Patient noted to be on antihypertensive medication of lisinopril prior to admission.  Blood pressure stable.  Continue to hold antihypertensive medications for now.  Follow.   8.??  Chronic pain syndrome Likely secondary to metastatic breast cancer.  Patient noted to be on methadone and Norco prior to admission.  Patient currently somewhat drowsy and as such continue to hold off on pain medication.  Once more alert and if with significant complaints of pain may resume home pain regimen at half the dose.  Follow.  DVT prophylaxis: SCDs Code Status: DNR Family Communication: Updated patient.  No family at bedside. Disposition:   Status is: Inpatient    Dispo:  Patient From: Home  Planned Disposition: Home  Expected discharge date: 3 to 4 days.  Medically stable for discharge: No        Consultants:   IR  PCCM: Dr.Wert  09/19/2020  Palliative care: Wadie Lessen, NP 09/19/2020  Procedures:   Ultrasound-guided thoracentesis 2 L hazy amber-colored fluid removed per IR, Ascencion Dike, PA 09/18/2020  CT chest without contrast 09/18/2020  Chest x-ray 09/17/2020, 09/18/2020: 09/19/2020  Antimicrobials:  IV cefepime 09/18/2020>>>>  IV Levaquin 12/1 /2021x1 dose  IV vancomycin 09/18/2020>>>.     Subjective: Patient more alert today however having some periods of drowsiness.  Denies any chest pain.  Feels shortness of breath with no significant change over the past 24 hours however some improvement since admission.  Asking when she is going to be able to go home.  On 15 L high flow nasal cannula.    Objective: Vitals:   09/20/20 0500 09/20/20 0600 09/20/20 0722 09/20/20 0752  BP: (!) 124/105 126/77    Pulse: 91 90    Resp: (!) 26 (!) 23    Temp:   98.3 F (36.8 C)   TempSrc:   Axillary   SpO2: 96% 99%  93%  Weight: 71 kg     Height:        Intake/Output Summary (Last 24 hours) at 09/20/2020 0932 Last data filed at 09/20/2020 0200 Gross per 24 hour  Intake 510.45 ml  Output 1750 ml  Net -1239.55 ml   Filed Weights   09/18/20 0048 09/20/20 0500  Weight: 68 kg 71 kg    Examination:  General exam: On 15 L high flow nasal cannula. Status post  right mastectomy. Respiratory system: Diffuse bilateral crackles/coarse breath sounds L > R.  No wheezing noted.  Some decreased breath sounds on the right.  Poor to fair air movement.  No use of accessory muscles of respiration.  Cardiovascular system: Regular rate rhythm no murmurs rubs or gallops.  No JVD.  No lower extremity edema.  Gastrointestinal system: Abdomen is soft, nontender, nondistended, positive bowel sounds.  No rebound.  No guarding.  Central nervous system: More alert today however some periods of drowsiness.  Moving extremities spontaneously.  Extremities: Symmetric 5 x 5 power. Skin: No rashes, lesions or ulcers Psychiatry:  Judgement and insight appear normal. Mood & affect appropriate.     Data Reviewed: I have personally reviewed following labs and imaging studies  CBC: Recent Labs  Lab 09/17/20 2300 09/18/20 0618 09/19/20 0213  WBC 18.7* 12.7* 12.1*  NEUTROABS 16.3*  --   --   HGB 13.7 10.3* 12.4  HCT 42.1 31.5* 37.3  MCV 106.6* 106.8* 104.5*  PLT 153 99* 59*    Basic Metabolic Panel: Recent Labs  Lab 09/17/20 2300 09/18/20 0618 09/19/20 0808 09/19/20 1108  NA 138  --   --  138  K 4.8  --   --  4.7  CL 100  --   --  104  CO2 17*  --   --  25  GLUCOSE 86  --   --  93  BUN 18  --   --  35*  CREATININE 2.07* 1.21*  --  0.81  CALCIUM 9.3  --   --  9.3  MG  --   --  2.5*  --     GFR: Estimated Creatinine Clearance: 77.2 mL/min (by C-G formula based on SCr of 0.81 mg/dL).  Liver Function Tests: Recent Labs  Lab 09/17/20 2300 09/19/20 1108  AST 183* 115*  ALT 39 36  ALKPHOS 269* 234*  BILITOT 1.1 1.3*  PROT 7.2 7.3  ALBUMIN 2.6* 2.4*    CBG: No results for input(s): GLUCAP in the last 168 hours.   Recent Results (from the past 240 hour(s))  Resp Panel by RT-PCR (Flu A&B, Covid) Nasopharyngeal Swab     Status: None   Collection Time: 09/17/20 10:40 PM   Specimen: Nasopharyngeal Swab; Nasopharyngeal(NP) swabs in vial transport medium  Result Value Ref Range Status   SARS Coronavirus 2 by RT PCR NEGATIVE NEGATIVE Final    Comment: (NOTE) SARS-CoV-2 target nucleic acids are NOT DETECTED.  The SARS-CoV-2 RNA is generally detectable in upper respiratory specimens during the acute phase of infection. The lowest concentration of SARS-CoV-2 viral copies this assay can detect is 138 copies/mL. A negative result does not preclude SARS-Cov-2 infection and should not be used as the sole basis for treatment or other patient management decisions. A negative result may occur with  improper specimen collection/handling, submission of specimen other than nasopharyngeal swab, presence  of viral mutation(s) within the areas targeted by this assay, and inadequate number of viral copies(<138 copies/mL). A negative result must be combined with clinical observations, patient history, and epidemiological information. The expected result is Negative.  Fact Sheet for Patients:  EntrepreneurPulse.com.au  Fact Sheet for Healthcare Providers:  IncredibleEmployment.be  This test is no t yet approved or cleared by the [place] FDA and  has been authorized for detection and/or diagnosis of SARS-CoV-2 by FDA under an Emergency Use Authorization (EUA). This EUA will remain  in effect (meaning this test can be used) for the duration of the  COVID-19 declaration under Section 564(b)(1) of the Act, 21 U.S.C.section 360bbb-3(b)(1), unless the authorization is terminated  or revoked sooner.       Influenza A by PCR NEGATIVE NEGATIVE Final   Influenza B by PCR NEGATIVE NEGATIVE Final    Comment: (NOTE) The Xpert Xpress SARS-CoV-2/FLU/RSV plus assay is intended as an aid in the diagnosis of influenza from Nasopharyngeal swab specimens and should not be used as a sole basis for treatment. Nasal washings and aspirates are unacceptable for Xpert Xpress SARS-CoV-2/FLU/RSV testing.  Fact Sheet for Patients: EntrepreneurPulse.com.au  Fact Sheet for Healthcare Providers: IncredibleEmployment.be  This test is not yet approved or cleared by the [place] FDA and has been authorized for detection and/or diagnosis of SARS-CoV-2 by FDA under an Emergency Use Authorization (EUA). This EUA will remain in effect (meaning this test can be used) for the duration of the COVID-19 declaration under Section 564(b)(1) of the Act, 21 U.S.C. section 360bbb-3(b)(1), unless the authorization is terminated or revoked.  Performed at [facility], [place] [address] [place], [place] [address]   Culture, blood  (routine x 2)     Status: None (Preliminary result)   Collection Time: 09/18/20  6:18 AM   Specimen: BLOOD  Result Value Ref Range Status   Specimen Description   Final    BLOOD LEFT ANTECUBITAL Performed at [facility] [address] [place], [place] [address]    Special Requests   Final    BOTTLES DRAWN AEROBIC ONLY Blood Culture results may not be optimal due to an excessive volume of blood received in culture bottles Performed at [facility] [address], [place], [place] [address]    Culture   Final    NO GROWTH 2 DAYS Performed at [facility] [address], [place], [place] [address]    Report Status PENDING  Incomplete  Culture, blood (routine x 2)     Status: None (Preliminary result)   Collection Time: 09/18/20  6:18 AM   Specimen: BLOOD  Result Value Ref Range Status   Specimen Description   Final    BLOOD LEFT WRIST Performed at [facility] [address] [place], [place] [address]    Special Requests   Final    BOTTLES DRAWN AEROBIC ONLY Blood Culture adequate volume Performed at [facility] [address] [place], [place] [address]    Culture   Final    NO GROWTH 2 DAYS Performed at [facility] [address] [place], [place] [address]    Report Status PENDING  Incomplete  Culture, Urine     Status: Abnormal   Collection Time: 09/19/20  8:41 AM   Specimen: Urine, Clean Catch  Result Value Ref Range Status   Specimen Description   Final    URINE, CLEAN CATCH Performed at [facility], [place] [address] [place], [place] [address]    Special Requests   Final    NONE Performed at [facility], [place] [address], [place], [place] [address]    Culture (A)  Final    <10,000 COLONIES/mL INSIGNIFICANT GROWTH Performed at [facility] [address], [place], [place] [address]    Report Status 09/20/2020 FINAL  Final          Radiology Studies: DG CHEST PORT 1 VIEW  Result Date: 09/20/2020 CLINICAL DATA:  Respiratory failure. EXAM: PORTABLE CHEST 1 VIEW COMPARISON:  09/19/2020.  CT 09/18/2020. FINDINGS: PowerPort catheter stable position. Heart size stable. Diffuse bilateral pulmonary infiltrates/edema again noted without interim change. Right pleural effusion again noted with slight from prior exam. Right mastectomy. Bony lesions suggesting metastatic disease best identified on prior CT. IMPRESSION: 1. Diffuse bilateral pulmonary infiltrates/edema again noted without interim change. Right pleural effusion again noted with slight from prior exam. 2. Right mastectomy. Bony lesions suggesting metastatic disease best identified on prior CT. Electronically Signed   By: Marcello Moores  Register   On: 09/20/2020 05:47   DG CHEST PORT 1 VIEW  Result Date: 09/19/2020 CLINICAL DATA:  Shortness of breath EXAM: PORTABLE CHEST 1 VIEW COMPARISON:  September 18, 2020 FINDINGS: Port-A-Cath tip is in the superior vena cava. No pneumothorax. There is an overall mild increase in diffuse airspace opacity bilaterally. There is a small right pleural effusion. Heart is upper normal in size with pulmonary vascularity within normal limits. No adenopathy. No bone lesions. IMPRESSION: Small right pleural effusion. Overall increase in airspace opacity bilaterally which may represent progression of multifocal pneumonia. A degree of underlying pulmonary edema cannot be excluded. Both edema and pneumonia may be present concurrently. Stable cardiac silhouette. Stable Port-A-Cath placement. Electronically Signed   By: Lowella Grip III M.D.   On: 09/19/2020 08:24   DG Chest Port 1 View  Result Date: 09/18/2020 CLINICAL DATA:  Status post right thoracentesis. EXAM: PORTABLE CHEST 1 VIEW COMPARISON:  Chest radiograph from one day prior. FINDINGS: Stable left subclavian Port-A-Cath terminating in the middle third of the SVC. Stable cardiomediastinal  silhouette with mild cardiomegaly. No pneumothorax. Small right pleural effusion, significantly decreased. Stable small left pleural effusion. Improved aeration at the right lung base. Persistent diffuse patchy hazy lung opacities, left greater than right. IMPRESSION: 1. No pneumothorax. Small right pleural effusion, significantly decreased. 2. Stable small left pleural effusion. 3. Improved aeration at the right lung base. 4. Persistent diffuse patchy hazy lung opacities, left greater than right, favoring multilobar pneumonia. Electronically Signed   By: Ilona Sorrel M.D.   On: 09/18/2020 16:04   US THORACENTESIS ASP PLEURAL SPACE W/IMG GUIDE  Result Date: 09/18/2020 INDICATION: History of metastatic breast cancer. Shortness of breath. Large right pleural effusion. Request therapeutic thoracentesis. EXAM: ULTRASOUND GUIDED RIGHT THORACENTESIS MEDICATIONS: 1% plain lidocaine, 5 mL COMPLICATIONS: None immediate. PROCEDURE: An ultrasound guided thoracentesis was thoroughly discussed with the patient and questions answered. The benefits, risks, alternatives and complications were also discussed. The patient understands and wishes to proceed with the procedure. Written consent was obtained. Ultrasound was performed to localize and mark an adequate pocket of fluid in the right chest. The area was then prepped and draped in the normal sterile fashion. 1% Lidocaine was used for local anesthesia. Under ultrasound guidance a 6 Fr Safe-T-Centesis catheter was introduced. Thoracentesis was performed. The catheter was removed and a dressing applied. FINDINGS: A total of approximately 2 L of slightly hazy, amber colored fluid was removed. IMPRESSION: Successful ultrasound guided right thoracentesis yielding 2 L of pleural fluid. Read by: Ascencion Dike PA-C Electronically Signed   By: Aletta Edouard M.D.   On: 09/18/2020 15:41        Scheduled Meds: . budesonide (PULMICORT) nebulizer solution  0.5 mg Nebulization  BID  . busPIRone  5 mg Oral BID  . Chlorhexidine Gluconate Cloth  6 each Topical Daily  . feeding supplement  237 mL Oral BID BM  . fluticasone  2 spray Each Nare Daily  . furosemide  20 mg Intravenous Q12H  . guaiFENesin  1,200 mg Oral BID  . ipratropium-albuterol  3 mL Nebulization TID  . loratadine  10 mg Oral Daily  . mouth rinse  15 mL Mouth Rinse BID  . pantoprazole  40 mg Oral Q0600  . rOPINIRole  0.5 mg Oral QHS  . sertraline  100 mg Oral BID   Continuous Infusions: . ceFEPime (MAXIPIME) IV Stopped (09/20/20 0547)  . vancomycin Stopped (09/19/20 2301)     LOS: 2 days    Time spent: [place]    Irine Seal, MD [facility]   To contact the attending provider between 7A-7P or the covering provider during after hours 7P-7A, please log into the web site [email or web address] and access using universal [place] password for that web site. If you do not have the password, please call the hospital operator.  09/20/2020, 9:32 AM

## 2020-09-21 LAB — CBC WITH DIFFERENTIAL/PLATELET
Abs Immature Granulocytes: 0 10*3/uL (ref 0.00–0.07)
Band Neutrophils: 0 %
Basophils Absolute: 0 10*3/uL (ref 0.0–0.1)
Basophils Relative: 0 %
Blasts: 0 %
Eosinophils Absolute: 0.2 10*3/uL (ref 0.0–0.5)
Eosinophils Relative: 3 %
HCT: 37.4 % (ref 36.0–46.0)
Hemoglobin: 12.4 g/dL (ref 12.0–15.0)
Lymphocytes Relative: 13 %
Lymphs Abs: 1 10*3/uL (ref 0.7–4.0)
MCH: 34.9 pg — ABNORMAL HIGH (ref 26.0–34.0)
MCHC: 33.2 g/dL (ref 30.0–36.0)
MCV: 105.4 fL — ABNORMAL HIGH (ref 80.0–100.0)
Metamyelocytes Relative: 0 %
Monocytes Absolute: 0.3 10*3/uL (ref 0.1–1.0)
Monocytes Relative: 4 %
Myelocytes: 0 %
Neutro Abs: 6.5 10*3/uL (ref 1.7–7.7)
Neutrophils Relative %: 80 %
Other: 0 %
Platelets: 40 10*3/uL — ABNORMAL LOW (ref 150–400)
Promyelocytes Relative: 0 %
RBC: 3.55 MIL/uL — ABNORMAL LOW (ref 3.87–5.11)
RDW: 18 % — ABNORMAL HIGH (ref 11.5–15.5)
WBC: 8 10*3/uL (ref 4.0–10.5)
nRBC: 0 % (ref 0.0–0.2)
nRBC: 0 /100 WBC

## 2020-09-21 LAB — LACTIC ACID, PLASMA: Lactic Acid, Venous: 2.4 mmol/L (ref 0.5–1.9)

## 2020-09-21 LAB — COMPREHENSIVE METABOLIC PANEL
ALT: 29 U/L (ref 0–44)
AST: 85 U/L — ABNORMAL HIGH (ref 15–41)
Albumin: 2 g/dL — ABNORMAL LOW (ref 3.5–5.0)
Alkaline Phosphatase: 218 U/L — ABNORMAL HIGH (ref 38–126)
Anion gap: 9 (ref 5–15)
BUN: 27 mg/dL — ABNORMAL HIGH (ref 6–20)
CO2: 29 mmol/L (ref 22–32)
Calcium: 9.7 mg/dL (ref 8.9–10.3)
Chloride: 103 mmol/L (ref 98–111)
Creatinine, Ser: 0.72 mg/dL (ref 0.44–1.00)
GFR, Estimated: >60 mL/min (ref >60)
Glucose, Bld: 91 mg/dL (ref 70–99)
Potassium: 3.1 mmol/L — ABNORMAL LOW (ref 3.5–5.1)
Sodium: 141 mmol/L (ref 135–145)
Total Bilirubin: 1.2 mg/dL (ref 0.3–1.2)
Total Protein: 5.9 g/dL — ABNORMAL LOW (ref 6.5–8.1)

## 2020-09-21 LAB — PROCALCITONIN: Procalcitonin: 17.8 ng/mL

## 2020-09-21 LAB — BRAIN NATRIURETIC PEPTIDE: B Natriuretic Peptide: 105.7 pg/mL — ABNORMAL HIGH (ref 0.0–100.0)

## 2020-09-21 MED ORDER — PANTOPRAZOLE SODIUM 40 MG PO TBEC
40.0000 mg | DELAYED_RELEASE_TABLET | Freq: Two times a day (BID) | ORAL | Status: DC
  Filled 2020-09-21: qty 1

## 2020-09-21 MED ORDER — PANTOPRAZOLE SODIUM 40 MG PO PACK
40.0000 mg | PACK | Freq: Two times a day (BID) | ORAL | Status: DC
  Administered 2020-09-21 – 2020-09-23 (×4): 40 mg
  Filled 2020-09-21 (×5): qty 20

## 2020-09-21 MED ORDER — PANTOPRAZOLE SODIUM 40 MG IV SOLR
40.0000 mg | Freq: Two times a day (BID) | INTRAVENOUS | Status: DC
  Administered 2020-09-21: 11:00:00 40 mg via INTRAVENOUS
  Filled 2020-09-21: qty 40

## 2020-09-21 NOTE — Progress Notes (Signed)
PROGRESS NOTE    Nasyah Goth  [id number] DOB: October 14, 1965 DOA: 09/17/2020 PCP: Welford Roche, NP    Chief Complaint  Patient presents with  . Shortness of Breath    Brief Narrative:   Cleola Brandley Willcoxis a 54 y.o.femalewith medical history significant ofmetastatic breast cancer, depression, who normally gets her care in [place] and apparently has voluntarily stopped treatment coming in with progressive shortness of breath and cough. Patient was evaluated and found to have a large pleural effusion. She also admits sepsis criteria.  Subjective:  She is very weak today, remains on high flow oxygen supplement, does not appear in respiratory distress She denies pain  Assessment & Plan:   Principal Problem:   Acute on chronic respiratory failure with hypoxemia (HCC) Active Problems:   Malignant neoplasm of upper-outer quadrant of right female breast (HCC)   Cancer, metastatic to bone (HCC)   Pleural effusion, right   Leucocytosis   Sepsis ([place])   Acute respiratory failure with hypoxia ([place])   Community acquired pneumonia of right lung   Palliative care by specialist   Goals of care, counseling/discussion   General weakness   Acute on chronic respiratory failure with hypoxemia likely from large right-sided pleural effusion cannot rule out underlying pneumonia or lymphangitic spreading of the cancer -She underwent thoracentesis with 2 L fluids removed, I do not see any fluid study COVID-19 PCR negative Flu PCR negative Blood culture no growth Pulmonary critical care consulted recommend antibiotic treatment for now and consider palliative care if not improving Likely transition to full comfort hospice in the next 24 hours Palliative care following  Sepsis from pneumonia present on admission Treated with antibiotic Likely transition to full comfort hospice in the next 24 hours  Likely metastatic breast cancer Patient stated has had breast cancer for  approximately 11 years.  Status post right mastectomy.  Patient noted to have quit treatment voluntarily Palliative care following  DVT prophylaxis: Place and maintain sequential compression device Start: 09/19/20 0942   Code Status: DNR Family Communication: Patient Disposition:   Status is: Inpatient   Dispo: The patient is from: Home              Anticipated d/c is to: Home hospice versus red potential hospice pending oxygen requirement                 Consultants:   Pulmonary critical care  Palliative care  Interventional radiology  Procedures:   Thoracentesis  Antimicrobials:   As above     Objective: Vitals:   09/21/20 1300 09/21/20 1400 09/21/20 1500 09/21/20 1600  BP: (!) 113/57 (!) 108/53 117/66 110/66  Pulse: 93 89 92 90  Resp: (!) 21 18 (!) 23 18  Temp:    98.1 F (36.7 C)  TempSrc:    Axillary  SpO2: 96% 90% 99% 93%  Weight:      Height:        Intake/Output Summary (Last 24 hours) at 09/21/2020 1810 Last data filed at 09/21/2020 1600 Gross per 24 hour  Intake 1580.87 ml  Output 3300 ml  Net -1719.13 ml   Filed Weights   09/18/20 0048 09/20/20 0500 09/21/20 0500  Weight: 68 kg 71 kg 64 kg    Examination:  General exam: Very weak Respiratory system: Diminished ,poor respiratory effort Cardiovascular system: S1 & S2 heard, RRR. No JVD, no murmur, No pedal edema. Gastrointestinal system: Abdomen is nondistended, soft and nontender.  Normal bowel sounds heard. Central nervous system: Very weak ,  but oriented  Extremities: Generalized weakness Skin: Ecchymosis Psychiatry: Very weak, no agitation    Data Reviewed: I have personally reviewed following labs and imaging studies  CBC: Recent Labs  Lab 09/17/20 2300 09/18/20 0618 09/19/20 0213 09/20/20 1227 09/21/20 0306  WBC 18.7* 12.7* 12.1* 7.9 8.0  NEUTROABS 16.3*  --   --  6.4 6.5  HGB 13.7 10.3* 12.4 12.9 12.4  HCT 42.1 31.5* 37.3 37.8 37.4  MCV 106.6* 106.8* 104.5* 101.9*  105.4*  PLT 153 99* 59* 37* 40*    Basic Metabolic Panel: Recent Labs  Lab 09/17/20 2300 09/18/20 0618 09/19/20 0808 09/19/20 1108 09/20/20 1118 09/21/20 0306  NA 138  --   --  138 139 141  K 4.8  --   --  4.7 4.6 3.1*  CL 100  --   --  104 104 103  CO2 17*  --   --  25 28 29   GLUCOSE 86  --   --  93 90 91  BUN 18  --   --  35* 32* 27*  CREATININE 2.07* 1.21*  --  0.81 0.76 0.72  CALCIUM 9.3  --   --  9.3 9.3 9.7  MG  --   --  2.5*  --   --   --     GFR: Estimated Creatinine Clearance: 78.2 mL/min (by C-G formula based on SCr of 0.72 mg/dL).  Liver Function Tests: Recent Labs  Lab 09/17/20 2300 09/19/20 1108 09/20/20 1118 09/21/20 0306  AST 183* 115* 90* 85*  ALT 39 36 29 29  ALKPHOS 269* 234* 195* 218*  BILITOT 1.1 1.3* 1.2 1.2  PROT 7.2 7.3 6.1* 5.9*  ALBUMIN 2.6* 2.4* 2.1* 2.0*    CBG: No results for input(s): GLUCAP in the last 168 hours.   Recent Results (from the past 240 hour(s))  Resp Panel by RT-PCR (Flu A&B, Covid) Nasopharyngeal Swab     Status: None   Collection Time: 09/17/20 10:40 PM   Specimen: Nasopharyngeal Swab; Nasopharyngeal(NP) swabs in vial transport medium  Result Value Ref Range Status   SARS Coronavirus 2 by RT PCR NEGATIVE NEGATIVE Final    Comment: (NOTE) SARS-CoV-2 target nucleic acids are NOT DETECTED.  The SARS-CoV-2 RNA is generally detectable in upper respiratory specimens during the acute phase of infection. The lowest concentration of SARS-CoV-2 viral copies this assay can detect is 138 copies/mL. A negative result does not preclude SARS-Cov-2 infection and should not be used as the sole basis for treatment or other patient management decisions. A negative result may occur with  improper specimen collection/handling, submission of specimen other than nasopharyngeal swab, presence of viral mutation(s) within the areas targeted by this assay, and inadequate number of viral copies(<138 copies/mL). A negative result must be  combined with clinical observations, patient history, and epidemiological information. The expected result is Negative.  Fact Sheet for Patients:  EntrepreneurPulse.com.au  Fact Sheet for Healthcare Providers:  IncredibleEmployment.be  This test is no t yet approved or cleared by the [place] FDA and  has been authorized for detection and/or diagnosis of SARS-CoV-2 by FDA under an Emergency Use Authorization (EUA). This EUA will remain  in effect (meaning this test can be used) for the duration of the COVID-19 declaration under Section 564(b)(1) of the Act, 21 U.S.C.section 360bbb-3(b)(1), unless the authorization is terminated  or revoked sooner.       Influenza A by PCR NEGATIVE NEGATIVE Final   Influenza B by PCR NEGATIVE NEGATIVE Final  Comment: (NOTE) The Xpert Xpress SARS-CoV-2/FLU/RSV plus assay is intended as an aid in the diagnosis of influenza from Nasopharyngeal swab specimens and should not be used as a sole basis for treatment. Nasal washings and aspirates are unacceptable for Xpert Xpress SARS-CoV-2/FLU/RSV testing.  Fact Sheet for Patients: BloggerCourse.com  Fact Sheet for Healthcare Providers: SeriousBroker.it  This test is not yet approved or cleared by the [place] FDA and has been authorized for detection and/or diagnosis of SARS-CoV-2 by FDA under an Emergency Use Authorization (EUA). This EUA will remain in effect (meaning this test can be used) for the duration of the COVID-19 declaration under Section 564(b)(1) of the Act, 21 U.S.C. section 360bbb-3(b)(1), unless the authorization is terminated or revoked.  Performed at [facility], [address] [place], [place] [address]   Culture, blood (routine x 2)     Status: None (Preliminary result)   Collection Time: 09/18/20  6:18 AM   Specimen: BLOOD  Result Value Ref Range Status    Specimen Description   Final    BLOOD LEFT ANTECUBITAL Performed at [facility], [address], [place], [place] [address]    Special Requests   Final    BOTTLES DRAWN AEROBIC ONLY Blood Culture results may not be optimal due to an excessive volume of blood received in culture bottles Performed at [facility], [address], [place], [place] [address]    Culture   Final    NO GROWTH 3 DAYS Performed at [facility], [address], [place], [place] [address]    Report Status PENDING  Incomplete  Culture, blood (routine x 2)     Status: None (Preliminary result)   Collection Time: 09/18/20  6:18 AM   Specimen: BLOOD  Result Value Ref Range Status   Specimen Description   Final    BLOOD LEFT WRIST Performed at [facility], [address] [place], [place] [address]    Special Requests   Final    BOTTLES DRAWN AEROBIC ONLY Blood Culture adequate volume Performed at [facility], [address] [place], [place] [address]    Culture   Final    NO GROWTH 3 DAYS Performed at [facility], [address], [place], [place] [address]    Report Status PENDING  Incomplete  Culture, Urine     Status: Abnormal   Collection Time: 09/19/20  8:41 AM   Specimen: Urine, Clean Catch  Result Value Ref Range Status   Specimen Description   Final    URINE, CLEAN CATCH Performed at [facility], [address] [place], [place] [address]    Special Requests   Final    NONE Performed at [facility], [address], [place], [place] [address]    Culture (A)  Final    <10,000 COLONIES/mL INSIGNIFICANT GROWTH Performed at [facility], [address] [place], [place] [address]    Report Status 09/20/2020 FINAL  Final         Radiology Studies: DG CHEST PORT 1 VIEW  Result Date: 09/20/2020 CLINICAL DATA:  Respiratory failure. EXAM: PORTABLE CHEST 1  VIEW COMPARISON:  09/19/2020.  CT 09/18/2020. FINDINGS: PowerPort catheter stable position. Heart size stable. Diffuse bilateral pulmonary infiltrates/edema again noted without interim change. Right pleural effusion again noted with slight from prior exam. Right mastectomy. Bony lesions suggesting metastatic disease best identified on prior CT. IMPRESSION: 1. Diffuse bilateral  pulmonary infiltrates/edema again noted without interim change. Right pleural effusion again noted with slight from prior exam. 2. Right mastectomy. Bony lesions suggesting metastatic disease best identified on prior CT. Electronically Signed   By: Marcello Moores  Register   On: 09/20/2020 05:47        Scheduled Meds: . budesonide (PULMICORT) nebulizer solution  0.5 mg Nebulization BID  . busPIRone  5 mg Oral BID  . Chlorhexidine Gluconate Cloth  6 each Topical Daily  . feeding supplement  237 mL Oral BID BM  . fluticasone  2 spray Each Nare Daily  . furosemide  40 mg Intravenous Q12H  . guaiFENesin  1,200 mg Oral BID  . ipratropium-albuterol  3 mL Nebulization TID  . loratadine  10 mg Oral Daily  . mouth rinse  15 mL Mouth Rinse BID  . pantoprazole  40 mg Oral BID  . rOPINIRole  0.5 mg Oral QHS  . sertraline  100 mg Oral BID   Continuous Infusions: . sodium chloride 10 mL/hr at 09/21/20 1600  . ceFEPime (MAXIPIME) IV Stopped (09/21/20 1340)  . vancomycin Stopped (09/21/20 0025)     LOS: 3 days   Time spent: 73mins, case discussed with palliative care  I reviewed all nursing notes, pharmacy notes, consultant notes,  vitals, pertinent old records  I have discussed plan of care as described above with RN , patient  on 09/21/2020  Voice Recognition /Dragon dictation system was used to create this note, attempts have been made to correct errors. Please contact the author with questions and/or clarifications.   Florencia Reasons, MD PhD FACP [facility]  Available via Epic secure chat 7am-7pm for nonurgent  issues Please page for urgent issues To page the attending provider between 7A-7P or the covering provider during after hours 7P-7A, please log into the web site [email or web address] and access using universal [place] password for that web site. If you do not have the password, please call the hospital operator.    09/21/2020, 6:10 PM

## 2020-09-21 NOTE — Progress Notes (Signed)
Daily Progress Note   Patient Name: Shannon Garcia       Date: 09/21/2020 DOB: Mar 15, 1966  Age: 54 y.o. MRN#: [id number] Attending Physician: Florencia Reasons, MD Primary Care Physician: Welford Roche, NP Admit Date: 09/17/2020  Reason for Consultation/Follow-up: Establishing goals of care  Subjective: Patient earlier in the day was on 6-8 L high flow nasal cannula, attempts being made to wean down supplemental oxygen.  Patient appears with generalized weakness, is pale and weak.  She opens her eyes but does not engage or interact much with me today, minimal oral intake.  Call placed and discussed with daughter Colletta [place].  We discussed about patient's current condition, prognosis and appropriate disposition options, see below.  Length of Stay: 3  Current Medications: Scheduled Meds:  . budesonide (PULMICORT) nebulizer solution  0.5 mg Nebulization BID  . busPIRone  5 mg Oral BID  . Chlorhexidine Gluconate Cloth  6 each Topical Daily  . feeding supplement  237 mL Oral BID BM  . fluticasone  2 spray Each Nare Daily  . furosemide  40 mg Intravenous Q12H  . guaiFENesin  1,200 mg Oral BID  . ipratropium-albuterol  3 mL Nebulization TID  . loratadine  10 mg Oral Daily  . mouth rinse  15 mL Mouth Rinse BID  . pantoprazole  40 mg Oral BID  . rOPINIRole  0.5 mg Oral QHS  . sertraline  100 mg Oral BID    Continuous Infusions: . sodium chloride 10 mL/hr at 09/21/20 1200  . ceFEPime (MAXIPIME) IV Stopped (09/21/20 1340)  . vancomycin Stopped (09/21/20 0025)    PRN Meds: sodium chloride, acetaminophen **OR** acetaminophen, guaiFENesin, LORazepam, ondansetron **OR** ondansetron (ZOFRAN) IV, sodium chloride flush  Physical Exam         Appears frail and weak Currently on nonrebreather as  well as supplemental oxygen Diminished breath sounds Appears with frailty S1-S2 Has no edema Awake but not entirely alert Appears with generalized weakness  Vital Signs: BP (!) 108/53   Pulse 89   Temp 97.8 F (36.6 C) (Oral)   Resp 18   Ht 5\' 7"  (1.702 m)   Wt 64 kg   LMP  (LMP Unknown)   SpO2 90%   BMI 22.10 kg/m  SpO2: SpO2: 90 % O2 Device: O2 Device: Nasal Cannula O2 Flow Rate: O2  Flow Rate (L/min): 4 L/min  Intake/output summary:   Intake/Output Summary (Last 24 hours) at 09/21/2020 1536 Last data filed at 09/21/2020 1400 Gross per 24 hour  Intake 1564.88 ml  Output 3300 ml  Net -1735.12 ml   LBM: Last BM Date:  (pta) Baseline Weight: Weight: 68 kg Most recent weight: Weight: 64 kg       Palliative Assessment/Data:      Patient Active Problem List   Diagnosis Date Noted  . Palliative care by specialist   . Goals of care, counseling/discussion   . General weakness   . Community acquired pneumonia of right lung   . Pleural effusion, right 09/18/2020  . Leucocytosis 09/18/2020  . Sepsis ([place]) 09/18/2020  . Acute on chronic respiratory failure with hypoxemia ([place]) 09/18/2020  . Acute respiratory failure with hypoxia ([place]) 09/18/2020  . Anemia, vitamin B12 deficiency 06/26/2020  . Cancer, metastatic to bone ([place]) 06/26/2020  . Malignant neoplasm of upper-outer quadrant of right female breast ([place]) 06/19/2020    Palliative Care Assessment & Plan   Patient Profile:  54 y.o. female admitted on 09/17/2020 with past medical history significant ofmetastatic breast cancer/Dr Hinton Rao in [place]/made decision to stop treatment. depression/daughter reports suicide attempts in the past, chronic pain/home medications to includes Methadone  Admitted through the emergency room with progressive shortness of breath and cough.  Noted anemia and leukocytosis Prior to this admission she was living alone with support from family.  Noted continued physical and functional  decline.  Patient was evaluated and found to have a large pleural effusion.  Patient has not been vaccinated against the Covid.      COVID-19 screen negative.  Patient admitted for further evaluation and treatment.  Assessment:  Acute on chronic respiratory failure with hypoxemia likely secondary to large right-sided pleural effusion/?  Parapneumonic effusion and pneumonia/?  Pulmonary edema/volume overload. Chronic pain syndrome.    Recommendations/Plan:  Call placed and discussed with patient's daughter.  Discussed about the patient being most appropriate for hospice philosophy of care with focus on comfort measures aggressive symptom management and avoidance of suffering.  Patient's daughter is in agreement.  We discussed about differences between home with hospice versus residential hospice.  Patient's daughter wants to bring the patient home to live with her for hospice care however the patient's daughter has 3 young children and works full-time.  We discussed about how residential hospice could meet the patient's needs and be more appropriate at this juncture.  She will discuss further with the patient's sisters and also with her husband.  Continue current mode of care for now.  Plan is likely for residential hospice towards the end of this hospitalization.  Once family has decided on residential hospice, it will be for hospice of [facility] in [place], [place].  Palliative medicine team to follow-up, include TOC for hospice consultation on 09-22-20 after family discussion.  Code Status:    Code Status Orders  (From admission, onward)         Start     Ordered   09/19/20 1508  Do not attempt resuscitation (DNR)  Continuous       Question Answer Comment  In the event of cardiac or respiratory ARREST Do not call a "code blue"   In the event of cardiac or respiratory ARREST Do not perform Intubation, CPR, defibrillation or ACLS   In the event of cardiac or respiratory  ARREST Use medication by any route, position, wound care, and other measures to relive  pain and suffering. May use oxygen, suction and manual treatment of airway obstruction as needed for comfort.      09/19/20 1507        Code Status History    Date Active Date Inactive Code Status Order ID Comments User Context   09/18/2020 0403 09/19/2020 1507 Full Code [id number]  Elwyn Reach, MD ED   Advance Care Planning Activity       Prognosis:   guarded.  Likely less than 2 weeks Discharge Planning:  To Be Determined Likely for home with hospice versus residential hospice   Care plan was discussed with patient, [facility] MD, RN, patient'sdaughter   Thank you for allowing the Palliative Medicine Team to assist in the care of this patient.   Time In: 1500 Time Out: 1535 Total Time 35 Prolonged Time Billed  no       Greater than 50%  of this time was spent counseling and coordinating care related to the above assessment and plan.  Loistine Chance, MD  Please contact Palliative Medicine Team phone at [phone] for questions and concerns.

## 2020-09-21 NOTE — Progress Notes (Signed)

## 2020-09-22 DIAGNOSIS — R531 Weakness: Secondary | ICD-10-CM

## 2020-09-22 MED ORDER — POTASSIUM CHLORIDE CRYS ER 20 MEQ PO TBCR
40.0000 meq | EXTENDED_RELEASE_TABLET | Freq: Once | ORAL | Status: AC
  Administered 2020-09-22: 19:00:00 40 meq via ORAL
  Filled 2020-09-22: qty 2

## 2020-09-22 NOTE — Progress Notes (Signed)
Patient ID: Shannon Garcia, female   DOB: 12-Mar-1966, 54 y.o.   MRN: [id number]  54 y.o.femalewith medical history significant ofmetastatic breast cancer, depression admitted for worsening shortness of breath. She was  found to have a large pleural effusion.   CT scan showed interval progression of right-sided pleural metastasis. Currently in the ICU with acute on chronic respiratory failure. Protein calorie malnutrition, albumin 2.9. Intermittently confused overall failure to thrive. Poor prognosis  This NP visited patient at the bedside as a follow up for palliative medicine needs and emotional support. Daughter/[name] contacted meet to discuss the next steps in transitions of care. She verbalizes an understanding of the seriousness of the current medical situation and the limited prognosis. Education offered on the difference between an aggressive medical intervention path and a palliative comfort path for this patient at this time in this situation.  Education offered on the difference between hospice benefit in the home versus residential. Daughter is unable to care for her mother in her home at this time. Residential hospice seems to be the best choice however she needs a little more time to process the situation and make that firm decision.   Plan of Care       -Focus of care is comfort and dignity - DNR/DNI - no artifical feeding or hydration now or in the future--diet as tolerated -Transition patient out of intensive care unit with no escalation of care, continue IV antibiotics -dc cardiac monitoring  -Support daughter/main decision maker as she makes decisions regarding transition of care -Re-assess patient in 24 to 48 hours for family's readiness for residential hospice   Discussed with daughterthe importance of continued conversation with her  family and the  medical providers regarding overall plan of care and treatment options,  ensuring decisions are within the context of  the patients values and GOCs.  This nurse practitioner informed  the family and the attending that I will be out of the hospital until Monday morning.  Dr Loistine Chance will be available for questions or concerns  Call palliative medicine team phone # [phone]   Questions and concerns addressed   Discussed with Dr Erlinda Hong via secure chat  Total time spent on the unit was 35 minutes  Greater than 50% of the time was spent in counseling and coordination of care  Wadie Lessen NP  Palliative Medicine Team Team Phone # [phone] Pager [phone]

## 2020-09-22 NOTE — Progress Notes (Signed)
Pharmacy Antibiotic Note  Shannon Garcia is a 54 y.o. female admitted on 09/17/2020 with SOB.  Pt is a breast cancer pt that has declined any further treatment for her cancer. Pharmacy has been consulted for cefepime and vancomycin dosing for pna.  09/22/20 12:47 PM    Day #5 vanc/cefepime  SCr WNL 12/22  MRSA PCR ordered but not sent/not in Epic  PCT elevated, increased to 17.8 (12/22)  Lactate down to 2.4 (12/22)  Afebrile  WBC down to WNL  Plan: cefepime to 2 g iv q 8 hours  vancomycin to 1750 mg iv q 24 hours Follow renal function cultures and clinical course Consider stopping abx today to complete 5 day course  Height: 5\' 7"  (170.2 cm) Weight: 63.5 kg (139 lb 15.9 oz) IBW/kg (Calculated) : 61.6  Temp (24hrs), Avg:98.3 F (36.8 C), Min:98 F (36.7 C), Max:98.7 F (37.1 C)  Recent Labs  Lab 09/17/20 0017 09/17/20 2300 09/18/20 0221 09/18/20 0618 09/19/20 0213 09/19/20 0808 09/19/20 1108 09/20/20 1118 09/20/20 1227 09/21/20 0306  WBC  --  18.7*  --  12.7* 12.1*  --   --   --  7.9 8.0  CREATININE  --  2.07*  --  1.21*  --   --  0.81 0.76  --  0.72  LATICACIDVEN 7.4*  --  5.8*  --   --  2.1*  --  3.1*  --  2.4*    Estimated Creatinine Clearance: 78.2 mL/min (by C-G formula based on SCr of 0.72 mg/dL).    Allergies  Allergen Reactions  . Wasp Venom Other (See Comments)    Unknown  . Azithromycin Rash   Antimicrobials this admission:  12/19 Levaquin x 1 12/19 Vanc >> 12/19 Cefepime >> Microbiology results:  12/18 Flu/Covid: neg/neg 12/19 BCx2: ngtd 12/19 pleural fluid: not sent 12/29 MRSA not sent 12/20 UCx: insig growth F 12/20 strep neg 12/20 legionella neg  Thank you for allowing pharmacy to be a part of this patient's care.  Eudelia Bunch, Pharm.D 09/22/2020 12:56 PM

## 2020-09-22 NOTE — Progress Notes (Signed)
PROGRESS NOTE    Shannon Garcia  JIR:[id number] DOB: 29-Sep-1966 DOA: 09/17/2020 PCP: Welford Roche, NP    Chief Complaint  Patient presents with  . Shortness of Breath    Brief Narrative:   Shannon Bronkema Willcoxis a 53 y.o.femalewith medical history significant ofmetastatic breast cancer, depression, who normally gets her care in [place] and apparently has voluntarily stopped treatment coming in with progressive shortness of breath and cough. Patient was evaluated and found to have a large pleural effusion. She also admits sepsis criteria.  Subjective:  She is very weak , on  8liter oxygen through nasal cannula, she does not appear in respiratory distress She denies pain  Assessment & Plan:   Principal Problem:   Acute on chronic respiratory failure with hypoxemia (HCC) Active Problems:   Malignant neoplasm of upper-outer quadrant of right female breast (HCC)   Cancer, metastatic to bone (HCC)   Pleural effusion, right   Leucocytosis   Sepsis ([place])   Acute respiratory failure with hypoxia ([place])   Community acquired pneumonia of right lung   Palliative care by specialist   Goals of care, counseling/discussion   General weakness   Acute on chronic respiratory failure with hypoxemia likely from large right-sided pleural effusion cannot rule out underlying pneumonia or lymphangitic spreading of the cancer -She underwent thoracentesis with 2 L fluids removed, I do not see any fluid study COVID-19 PCR negative Flu PCR negative Blood culture no growth Pulmonary critical care consulted recommend antibiotic treatment for now and consider palliative care if not improving Likely transition to full comfort hospice in the next 24 hours Palliative care following  Sepsis from pneumonia present on admission Treated with antibiotic Likely transition to full comfort hospice in the next 24 hours  Likely metastatic breast cancer Patient stated has had breast cancer for  approximately 11 years.  Status post right mastectomy.  Patient noted to have quit treatment voluntarily Palliative care following  DVT prophylaxis: Place and maintain sequential compression device Start: 09/19/20 0942   Code Status: DNR Family Communication: daughter over the phone Disposition:   Status is: Inpatient   Dispo: The patient is from: Home              Anticipated d/c is to: residential hospice                  Consultants:   Pulmonary critical care  Palliative care  Interventional radiology  Procedures:   Thoracentesis  Antimicrobials:   As above     Objective: Vitals:   09/22/20 1300 09/22/20 1400 09/22/20 1549 09/22/20 1551  BP: (!) 115/52 102/64 106/64   Pulse: 76 81 79 80  Resp: (!) 22 18 16 16   Temp:   (!) 97.5 F (36.4 C)   TempSrc:   Oral   SpO2: 96% 99% 97% 97%  Weight:      Height:        Intake/Output Summary (Last 24 hours) at 09/22/2020 1822 Last data filed at 09/22/2020 1433 Gross per 24 hour  Intake 819.89 ml  Output 1600 ml  Net -780.11 ml   Filed Weights   09/20/20 0500 09/21/20 0500 09/22/20 7510  Weight: 71 kg 64 kg 63.5 kg    Examination:  General exam: Very weak Respiratory system: Diminished ,poor respiratory effort Cardiovascular system: S1 & S2 heard, RRR. No JVD, no murmur, No pedal edema. Gastrointestinal system: Abdomen is nondistended, soft and nontender.  Normal bowel sounds heard. Central nervous system: Very weak , but  oriented  Extremities: Generalized weakness Skin: Ecchymosis Psychiatry: Very weak, no agitation    Data Reviewed: I have personally reviewed following labs and imaging studies  CBC: Recent Labs  Lab 09/17/20 2300 09/18/20 0618 09/19/20 0213 09/20/20 1227 09/21/20 0306  WBC 18.7* 12.7* 12.1* 7.9 8.0  NEUTROABS 16.3*  --   --  6.4 6.5  HGB 13.7 10.3* 12.4 12.9 12.4  HCT 42.1 31.5* 37.3 37.8 37.4  MCV 106.6* 106.8* 104.5* 101.9* 105.4*  PLT 153 99* 59* 37* 40*    Basic  Metabolic Panel: Recent Labs  Lab 09/17/20 2300 09/18/20 0618 09/19/20 0808 09/19/20 1108 09/20/20 1118 09/21/20 0306  NA 138  --   --  138 139 141  K 4.8  --   --  4.7 4.6 3.1*  CL 100  --   --  104 104 103  CO2 17*  --   --  25 28 29   GLUCOSE 86  --   --  93 90 91  BUN 18  --   --  35* 32* 27*  CREATININE 2.07* 1.21*  --  0.81 0.76 0.72  CALCIUM 9.3  --   --  9.3 9.3 9.7  MG  --   --  2.5*  --   --   --     GFR: Estimated Creatinine Clearance: 78.2 mL/min (by C-G formula based on SCr of 0.72 mg/dL).  Liver Function Tests: Recent Labs  Lab 09/17/20 2300 09/19/20 1108 09/20/20 1118 09/21/20 0306  AST 183* 115* 90* 85*  ALT 39 36 29 29  ALKPHOS 269* 234* 195* 218*  BILITOT 1.1 1.3* 1.2 1.2  PROT 7.2 7.3 6.1* 5.9*  ALBUMIN 2.6* 2.4* 2.1* 2.0*    CBG: No results for input(s): GLUCAP in the last 168 hours.   Recent Results (from the past 240 hour(s))  Resp Panel by RT-PCR (Flu A&B, Covid) Nasopharyngeal Swab     Status: None   Collection Time: 09/17/20 10:40 PM   Specimen: Nasopharyngeal Swab; Nasopharyngeal(NP) swabs in vial transport medium  Result Value Ref Range Status   SARS Coronavirus 2 by RT PCR NEGATIVE NEGATIVE Final    Comment: (NOTE) SARS-CoV-2 target nucleic acids are NOT DETECTED.  The SARS-CoV-2 RNA is generally detectable in upper respiratory specimens during the acute phase of infection. The lowest concentration of SARS-CoV-2 viral copies this assay can detect is 138 copies/mL. A negative result does not preclude SARS-Cov-2 infection and should not be used as the sole basis for treatment or other patient management decisions. A negative result may occur with  improper specimen collection/handling, submission of specimen other than nasopharyngeal swab, presence of viral mutation(s) within the areas targeted by this assay, and inadequate number of viral copies(<138 copies/mL). A negative result must be combined with clinical observations,  patient history, and epidemiological information. The expected result is Negative.  Fact Sheet for Patients:  EntrepreneurPulse.com.au  Fact Sheet for Healthcare Providers:  IncredibleEmployment.be  This test is no t yet approved or cleared by the [place] FDA and  has been authorized for detection and/or diagnosis of SARS-CoV-2 by FDA under an Emergency Use Authorization (EUA). This EUA will remain  in effect (meaning this test can be used) for the duration of the COVID-19 declaration under Section 564(b)(1) of the Act, 21 U.S.C.section 360bbb-3(b)(1), unless the authorization is terminated  or revoked sooner.       Influenza A by PCR NEGATIVE NEGATIVE Final   Influenza B by PCR NEGATIVE NEGATIVE Final  Comment: (NOTE) The Xpert Xpress SARS-CoV-2/FLU/RSV plus assay is intended as an aid in the diagnosis of influenza from Nasopharyngeal swab specimens and should not be used as a sole basis for treatment. Nasal washings and aspirates are unacceptable for Xpert Xpress SARS-CoV-2/FLU/RSV testing.  Fact Sheet for Patients: EntrepreneurPulse.com.au  Fact Sheet for Healthcare Providers: IncredibleEmployment.be  This test is not yet approved or cleared by the [place] FDA and has been authorized for detection and/or diagnosis of SARS-CoV-2 by FDA under an Emergency Use Authorization (EUA). This EUA will remain in effect (meaning this test can be used) for the duration of the COVID-19 declaration under Section 564(b)(1) of the Act, 21 U.S.C. section 360bbb-3(b)(1), unless the authorization is terminated or revoked.  Performed at [facility], [place] [address], [place], [place] [address]   Culture, blood (routine x 2)     Status: None (Preliminary result)   Collection Time: 09/18/20  6:18 AM   Specimen: BLOOD  Result Value Ref Range Status   Specimen Description   Final    BLOOD  LEFT ANTECUBITAL Performed at [facility] [address] [place], [place] [address]    Special Requests   Final    BOTTLES DRAWN AEROBIC ONLY Blood Culture results may not be optimal due to an excessive volume of blood received in culture bottles Performed at [facility] [address], [place], [place] [address]    Culture   Final    NO GROWTH 4 DAYS Performed at [facility], [place] [address], [place], [place] [address]    Report Status PENDING  Incomplete  Culture, blood (routine x 2)     Status: None (Preliminary result)   Collection Time: 09/18/20  6:18 AM   Specimen: BLOOD  Result Value Ref Range Status   Specimen Description   Final    BLOOD LEFT WRIST Performed at [facility] [address] [place], [place] [address]    Special Requests   Final    BOTTLES DRAWN AEROBIC ONLY Blood Culture adequate volume Performed at [facility] [address] [place], [place] [address]    Culture   Final    NO GROWTH 4 DAYS Performed at [facility], [place] [address], [place], [place] [address]    Report Status PENDING  Incomplete  Culture, Urine     Status: Abnormal   Collection Time: 09/19/20  8:41 AM   Specimen: Urine, Clean Catch  Result Value Ref Range Status   Specimen Description   Final    URINE, CLEAN CATCH Performed at [facility], [place] [address], [place], [place] [address]    Special Requests   Final    NONE Performed at [facility], [place] [address] [place], [place] [address]    Culture (A)  Final    <10,000 COLONIES/mL INSIGNIFICANT GROWTH Performed at [place] [address], [place],  [address]    Report Status 09/20/2020 FINAL  Final         Radiology Studies: No results found.      Scheduled Meds: . budesonide (PULMICORT) nebulizer solution  0.5 mg Nebulization BID  . busPIRone  5 mg Oral BID  .  Chlorhexidine Gluconate Cloth  6 each Topical Daily  . feeding supplement  237 mL Oral BID BM  . fluticasone  2 spray Each Nare Daily  . furosemide  40 mg Intravenous Q12H  . guaiFENesin  1,200 mg Oral BID  . ipratropium-albuterol  3 mL Nebulization TID  . loratadine  10 mg Oral Daily  . mouth rinse  15 mL Mouth Rinse BID  . pantoprazole sodium  40 mg Per Tube BID  . potassium chloride  40 mEq Oral Once  . rOPINIRole  0.5 mg Oral QHS  . sertraline  100 mg Oral BID   Continuous Infusions: . sodium chloride Stopped (09/22/20 1249)  . ceFEPime (MAXIPIME) IV Stopped (09/22/20 1531)  . vancomycin Stopped (09/21/20 2239)     LOS: 4 days   Time spent: 53mins, case discussed with palliative care  I reviewed all nursing notes, pharmacy notes, consultant notes,  vitals, pertinent old records  I have discussed plan of care as described above with RN , patient  And family on 09/22/2020  Voice Recognition /Dragon dictation system was used to create this note, attempts have been made to correct errors. Please contact the author with questions and/or clarifications.   Florencia Reasons, MD PhD FACP [facility]  Available via Epic secure chat 7am-7pm for nonurgent issues Please page for urgent issues To page the attending provider between 7A-7P or the covering provider during after hours 7P-7A, please log into the web site [email or web address] and access using universal [place] password for that web site. If you do not have the password, please call the hospital operator.    09/22/2020, 6:22 PM

## 2020-09-23 DIAGNOSIS — L8990 Pressure ulcer of unspecified site, unspecified stage: Secondary | ICD-10-CM | POA: Insufficient documentation

## 2020-09-23 LAB — CULTURE, BLOOD (ROUTINE X 2)
Culture: NO GROWTH
Culture: NO GROWTH
Special Requests: ADEQUATE

## 2020-09-23 NOTE — TOC Progression Note (Addendum)
Transition of Care [facility]) - Progression Note    Patient Details  Name: Shannon Garcia MRN: [id number] Date of Birth: 1966/05/31  [facility] CM/SW Contact  Georgi Tuel, Juliann Pulse, RN Phone Number: 09/23/2020, 12:53 PM  Clinical Narrative:Referral for residential hospice-spoke to dtr [name]-[phone]-chose [facility]-TC to [place] [phone]-closed-await call back from on call nurse Mattie-will continue to f/u.  1:23p-Received call from [place] for [facility]-she will review w/her MD & let CM know if able to accept today-MD updated.  2:14p-Forms for PTAR @ Nsg station if Accepted for [facility]-still awaiting outcome-MDNsg to call report tel#[phone] updated.   4p-[facility] accepted-PTAR called. Nsg call report tel#[phone]. No further CM needs.    Expected Discharge Plan: [place] Barriers to Discharge: No Barriers Identified  Expected Discharge Plan and Services Expected Discharge Plan: [place]   Discharge Planning Services: CM Consult   Living arrangements for the past 2 months: Apartment                                       Social Determinants of Health (SDOH) Interventions    Readmission Risk Interventions No flowsheet data found.

## 2020-09-23 NOTE — Discharge Summary (Signed)
Discharge Summary  Shannon Garcia [id number] DOB: 11/12/1965  PCP: Welford Roche, NP  Admit date: 09/17/2020 Discharge date: 09/23/2020  Time spent: 36mins, more than 50% time spent on coordination of care.   Discharge to residential hospice  Discharge Diagnoses:  Active Hospital Problems   Diagnosis Date Noted  . Acute on chronic respiratory failure with hypoxemia ([facility] [place]) 09/18/2020  . Pressure injury of skin 09/23/2020  . Palliative care by specialist   . Goals of care, counseling/discussion   . General weakness   . Community acquired pneumonia of right lung   . Pleural effusion, right 09/18/2020  . Leucocytosis 09/18/2020  . Sepsis ([place]) 09/18/2020  . Acute respiratory failure with hypoxia ([place]) 09/18/2020  . Cancer, metastatic to bone ([place]) 06/26/2020  . Malignant neoplasm of upper-outer quadrant of right female breast ([place]) 06/19/2020    Resolved Hospital Problems  No resolved problems to display.    Discharge Condition: stable  Diet recommendation: Regular diet  Filed Weights   09/21/20 0500 09/22/20 0212 09/23/20 0551  Weight: 64 kg 63.5 kg 64.6 kg    History of present illness: (Per admitting MD Dr. Jonelle Sidle) PCP: Welford Roche, NP   Outpatient Specialists: In [place] patient no longer on active treatment  Patient coming from: Home  Chief Complaint: Shortness of breath  HPI: Shannon Garcia is a 54 y.o. female with medical history significant of metastatic breast cancer, depression, who normally gets her care in [place] and apparently has voluntarily stopped treatment coming in with progressive shortness of breath and cough. Patient was evaluated and found to have a large pleural effusion. She also admits sepsis criteria. No fever or chills no nausea vomiting or diarrhea. Patient has not been vaccinated against the Covid. Patient is very anxious. Not forthcoming or some part of the history. Patient is still a full code who voiced her  desire to be intubated if it comes to that. It appears she has possible pneumonia also on top of that. COVID-19 screen today is negative. She is also anemic and leukocytosis present. Patient is being admitted for further evaluation and treatment.  Hospital Course:  Principal Problem:   Acute on chronic respiratory failure with hypoxemia (HCC) Active Problems:   Malignant neoplasm of upper-outer quadrant of right female breast (HCC)   Cancer, metastatic to bone (HCC)   Pleural effusion, right   Leucocytosis   Sepsis (HCC)   Acute respiratory failure with hypoxia ([place])   Community acquired pneumonia of right lung   Palliative care by specialist   Goals of care, counseling/discussion   General weakness   Pressure injury of skin   Acute on chronic respiratory failure with hypoxemia likely from large right-sided pleural effusion cannot rule out underlying pneumonia or lymphangitic spreading of the cancer -She underwent thoracentesis with 2 L fluids removed, I do not see any fluid study -COVID-19 PCR negative -Flu PCR negative -Blood culture no growth -Pulmonary critical care consulted recommend antibiotic treatment for now and consider palliative care if not improving -She received vancomycin and cefepime in the hospital, she also received IV Lasix - transition to full comfort , discharged to residential hospice -Palliative care input appreciated  Sepsis from pneumonia present on admission Treated with antibiotic vancomycin and cefepime  transition to full comfort /hospice  Likely metastatic breast cancer Patient stated has had breast cancer for approximately 11 years. Status post right mastectomy. Patient noted to have quit treatment voluntarily  transition to full comfort /hospice Palliative care input appreciated  DVT prophylaxis:  Place and maintain sequential compression device Start: 09/19/20 0942   Code Status: DNR Family Communication: daughter over the  phone Disposition:  Residential hospice      Consultants:   Pulmonary critical care  Palliative care  Interventional radiology  Procedures:   Thoracentesis  Antimicrobials:   As above   Discharge Exam: BP 110/68 (BP Location: Left Arm)   Pulse 87   Temp (!) 97.3 F (36.3 C) (Axillary)   Resp 16   Ht 5\' 7"  (1.702 m)   Wt 64.6 kg   LMP  (LMP Unknown)   SpO2 95%   BMI 22.31 kg/m   General: Very weak, chronic ill-appearing, await and able to carry conversation ,confused about the time,  Cardiovascular: rrr Respiratory: Poor respiratory effort, not in respiratory distress    Discharge Instructions    Diet general   Complete by: As directed    Discharge wound care:   Complete by: As directed    Pressure off loading   Increase activity slowly   Complete by: As directed      Allergies as of 09/23/2020      Reactions   Wasp Venom Other (See Comments)   Unknown   Azithromycin Rash      Medication List    STOP taking these medications   ALPRAZolam 1 MG tablet Commonly known as: XANAX   amoxicillin 500 MG capsule Commonly known as: AMOXIL   diphenoxylate-atropine 2.5-0.025 MG tablet Commonly known as: LOMOTIL   HYDROcodone-acetaminophen 10-325 MG tablet Commonly known as: NORCO   lisinopril 20 MG tablet Commonly known as: ZESTRIL   methadone 10 MG tablet Commonly known as: DOLOPHINE   pantoprazole 40 MG tablet Commonly known as: PROTONIX   potassium chloride SA 20 MEQ tablet Commonly known as: KLOR-CON   rOPINIRole 0.5 MG tablet Commonly known as: REQUIP   sertraline 100 MG tablet Commonly known as: ZOLOFT   Ventolin HFA 108 (90 Base) MCG/ACT inhaler Generic drug: albuterol            Discharge Care Instructions  (From admission, onward)         Start     Ordered   09/23/20 0000  Discharge wound care:       Comments: Pressure off loading   09/23/20 1504         Allergies  Allergen Reactions  . Wasp  Venom Other (See Comments)    Unknown  . Azithromycin Rash      The results of significant diagnostics from this hospitalization (including imaging, microbiology, ancillary and laboratory) are listed below for reference.    Significant Diagnostic Studies: CT Chest Wo Contrast  Result Date: 09/18/2020 CLINICAL DATA:  Shortness of breath. EXAM: CT CHEST WITHOUT CONTRAST TECHNIQUE: Multidetector CT imaging of the chest was performed following the standard protocol without IV contrast. COMPARISON:  August 03, 2020 FINDINGS: Cardiovascular: There is mild calcification of the aortic arch. Normal heart size with mild to moderate severity coronary artery calcification. No pericardial effusion. Mediastinum/Nodes: No enlarged mediastinal or axillary lymph nodes, however, this is limited in evaluation in the absence of intravenous contrast. Thyroid gland, trachea, and esophagus demonstrate no significant findings. Lungs/Pleura: Marked severity multifocal infiltrates are seen throughout both lungs. Moderate to marked severity consolidation is seen within the right lower lobe. There is a large right-sided pleural effusion. No pneumothorax is identified. Upper Abdomen: No acute abnormality. Musculoskeletal: There is evidence of prior right-sided mastectomy. Multiple stable mixed density lesions are again seen within the thoracic spine.  IMPRESSION: 1. Marked severity bilateral multifocal infiltrates with moderate to marked severity right lower lobe consolidation. 2. Large right-sided pleural effusion. 3. Findings consistent with osseous metastasis within the thoracic spine. 4. Evidence of prior right-sided mastectomy. 5. Aortic atherosclerosis. Aortic Atherosclerosis (ICD10-I70.0). Electronically Signed   By: Virgina Norfolk M.D.   On: 09/18/2020 03:48   DG CHEST PORT 1 VIEW  Result Date: 09/20/2020 CLINICAL DATA:  Respiratory failure. EXAM: PORTABLE CHEST 1 VIEW COMPARISON:  09/19/2020.  CT 09/18/2020.  FINDINGS: PowerPort catheter stable position. Heart size stable. Diffuse bilateral pulmonary infiltrates/edema again noted without interim change. Right pleural effusion again noted with slight from prior exam. Right mastectomy. Bony lesions suggesting metastatic disease best identified on prior CT. IMPRESSION: 1. Diffuse bilateral pulmonary infiltrates/edema again noted without interim change. Right pleural effusion again noted with slight from prior exam. 2. Right mastectomy. Bony lesions suggesting metastatic disease best identified on prior CT. Electronically Signed   By: Marcello Moores  Register   On: 09/20/2020 05:47   DG CHEST PORT 1 VIEW  Result Date: 09/19/2020 CLINICAL DATA:  Shortness of breath EXAM: PORTABLE CHEST 1 VIEW COMPARISON:  September 18, 2020 FINDINGS: Port-A-Cath tip is in the superior vena cava. No pneumothorax. There is an overall mild increase in diffuse airspace opacity bilaterally. There is a small right pleural effusion. Heart is upper normal in size with pulmonary vascularity within normal limits. No adenopathy. No bone lesions. IMPRESSION: Small right pleural effusion. Overall increase in airspace opacity bilaterally which may represent progression of multifocal pneumonia. A degree of underlying pulmonary edema cannot be excluded. Both edema and pneumonia may be present concurrently. Stable cardiac silhouette. Stable Port-A-Cath placement. Electronically Signed   By: Lowella Grip III M.D.   On: 09/19/2020 08:24   DG Chest Port 1 View  Result Date: 09/18/2020 CLINICAL DATA:  Status post right thoracentesis. EXAM: PORTABLE CHEST 1 VIEW COMPARISON:  Chest radiograph from one day prior. FINDINGS: Stable left subclavian Port-A-Cath terminating in the middle third of the SVC. Stable cardiomediastinal silhouette with mild cardiomegaly. No pneumothorax. Small right pleural effusion, significantly decreased. Stable small left pleural effusion. Improved aeration at the right lung base.  Persistent diffuse patchy hazy lung opacities, left greater than right. IMPRESSION: 1. No pneumothorax. Small right pleural effusion, significantly decreased. 2. Stable small left pleural effusion. 3. Improved aeration at the right lung base. 4. Persistent diffuse patchy hazy lung opacities, left greater than right, favoring multilobar pneumonia. Electronically Signed   By: Ilona Sorrel M.D.   On: 09/18/2020 16:04   DG Chest Port 1 View  Result Date: 09/17/2020 CLINICAL DATA:  Worsening shortness of breath for 2 weeks EXAM: PORTABLE CHEST 1 VIEW COMPARISON:  CT 08/03/2020, radiograph 05/09/2020 FINDINGS: Interval development of a large right pleural effusion which tracks laterally and over the right lung apex. Likely passive atelectatic change within the regions of adjacent opacity. Some additional perihilar hazy opacity in the right lung and diffusely throughout the left lung is noted with vascular congestion. A left subclavian approach Port-A-Cath is seen in the soft tissues of the left chest wall with the tip positioned near the superior cavoatrial junction. Right heart border largely obscured by adjacent opacity. Visible cardiomediastinal contours are unremarkable. No acute osseous abnormality. Some edematous changes in the soft tissues. Degenerative changes are present in the imaged spine and shoulders. Prior right mastectomy. Telemetry leads overlie the chest. IMPRESSION: 1. Interval development of a large right pleural effusion which tracks laterally and over the right lung apex with  adjacent areas of passive atelectatic change 2. Some additional perihilar hazy opacity in the right lung and diffusely throughout the left lung, likely reflect edema though underlying infection is difficult to exclude. Electronically Signed   By: Lovena Le M.D.   On: 09/17/2020 23:09   US THORACENTESIS ASP PLEURAL SPACE W/IMG GUIDE  Result Date: 09/18/2020 INDICATION: History of metastatic breast cancer. Shortness of  breath. Large right pleural effusion. Request therapeutic thoracentesis. EXAM: ULTRASOUND GUIDED RIGHT THORACENTESIS MEDICATIONS: 1% plain lidocaine, 5 mL COMPLICATIONS: None immediate. PROCEDURE: An ultrasound guided thoracentesis was thoroughly discussed with the patient and questions answered. The benefits, risks, alternatives and complications were also discussed. The patient understands and wishes to proceed with the procedure. Written consent was obtained. Ultrasound was performed to localize and mark an adequate pocket of fluid in the right chest. The area was then prepped and draped in the normal sterile fashion. 1% Lidocaine was used for local anesthesia. Under ultrasound guidance a 6 Fr Safe-T-Centesis catheter was introduced. Thoracentesis was performed. The catheter was removed and a dressing applied. FINDINGS: A total of approximately 2 L of slightly hazy, amber colored fluid was removed. IMPRESSION: Successful ultrasound guided right thoracentesis yielding 2 L of pleural fluid. Read by: Ascencion Dike PA-C Electronically Signed   By: Aletta Edouard M.D.   On: 09/18/2020 15:41    Microbiology: Recent Results (from the past 240 hour(s))  Resp Panel by RT-PCR (Flu A&B, Covid) Nasopharyngeal Swab     Status: None   Collection Time: 09/17/20 10:40 PM   Specimen: Nasopharyngeal Swab; Nasopharyngeal(NP) swabs in vial transport medium  Result Value Ref Range Status   SARS Coronavirus 2 by RT PCR NEGATIVE NEGATIVE Final    Comment: (NOTE) SARS-CoV-2 target nucleic acids are NOT DETECTED.  The SARS-CoV-2 RNA is generally detectable in upper respiratory specimens during the acute phase of infection. The lowest concentration of SARS-CoV-2 viral copies this assay can detect is 138 copies/mL. A negative result does not preclude SARS-Cov-2 infection and should not be used as the sole basis for treatment or other patient management decisions. A negative result may occur with  improper specimen  collection/handling, submission of specimen other than nasopharyngeal swab, presence of viral mutation(s) within the areas targeted by this assay, and inadequate number of viral copies(<138 copies/mL). A negative result must be combined with clinical observations, patient history, and epidemiological information. The expected result is Negative.  Fact Sheet for Patients:  EntrepreneurPulse.com.au  Fact Sheet for Healthcare Providers:  IncredibleEmployment.be  This test is no t yet approved or cleared by the [place] FDA and  has been authorized for detection and/or diagnosis of SARS-CoV-2 by FDA under an Emergency Use Authorization (EUA). This EUA will remain  in effect (meaning this test can be used) for the duration of the COVID-19 declaration under Section 564(b)(1) of the Act, 21 U.S.C.section 360bbb-3(b)(1), unless the authorization is terminated  or revoked sooner.       Influenza A by PCR NEGATIVE NEGATIVE Final   Influenza B by PCR NEGATIVE NEGATIVE Final    Comment: (NOTE) The Xpert Xpress SARS-CoV-2/FLU/RSV plus assay is intended as an aid in the diagnosis of influenza from Nasopharyngeal swab specimens and should not be used as a sole basis for treatment. Nasal washings and aspirates are unacceptable for Xpert Xpress SARS-CoV-2/FLU/RSV testing.  Fact Sheet for Patients: EntrepreneurPulse.com.au  Fact Sheet for Healthcare Providers: IncredibleEmployment.be  This test is not yet approved or cleared by the [place] and has been authorized  for detection and/or diagnosis of SARS-CoV-2 by FDA under an Emergency Use Authorization (EUA). This EUA will remain in effect (meaning this test can be used) for the duration of the COVID-19 declaration under Section 564(b)(1) of the Act, 21 U.S.C. section 360bbb-3(b)(1), unless the authorization is terminated or revoked.  Performed at [facility], [place] [address], [place], [place] [address]   Culture, blood (routine x 2)     Status: None (Preliminary result)   Collection Time: 09/18/20  6:18 AM   Specimen: BLOOD  Result Value Ref Range Status   Specimen Description   Final    BLOOD LEFT ANTECUBITAL Performed at [facility] [address], [place], [place] [address]    Special Requests   Final    BOTTLES DRAWN AEROBIC ONLY Blood Culture results may not be optimal due to an excessive volume of blood received in culture bottles Performed at [facility] [address], [place], [place] [address]    Culture   Final    NO GROWTH 4 DAYS Performed at [facility], [place] [address], [place], [place] [address]    Report Status PENDING  Incomplete  Culture, blood (routine x 2)     Status: None (Preliminary result)   Collection Time: 09/18/20  6:18 AM   Specimen: BLOOD  Result Value Ref Range Status   Specimen Description   Final    BLOOD LEFT WRIST Performed at [facility] [address], [place], [place] [address]    Special Requests   Final    BOTTLES DRAWN AEROBIC ONLY Blood Culture adequate volume Performed at [facility] [address] [place], [place] [address]    Culture   Final    NO GROWTH 4 DAYS Performed at [facility], [place] [address], [place], [place] [address]    Report Status PENDING  Incomplete  Culture, Urine     Status: Abnormal   Collection Time: 09/19/20  8:41 AM   Specimen: Urine, Clean Catch  Result Value Ref Range Status   Specimen Description   Final    URINE, CLEAN CATCH Performed at [facility], [place] [address] [place], [place] [address]    Special Requests   Final    NONE Performed at [facility], [place] [address] [place], [place] [address]    Culture (A)  Final    <10,000 COLONIES/mL INSIGNIFICANT GROWTH Performed at [facility] [address], [place],  [address]    Report Status 09/20/2020 FINAL  Final     Labs: Basic Metabolic Panel: Recent Labs  Lab 09/17/20 2300 09/18/20 0618 09/19/20 0808 09/19/20 1108 09/20/20 1118 09/21/20 0306  NA 138  --   --  138 139 141  K 4.8  --   --  4.7 4.6 3.1*  CL 100  --   --  104 104 103  CO2 17*  --   --  25 28 29   GLUCOSE 86  --   --  93 90 91  BUN 18  --   --  35* 32* 27*  CREATININE 2.07* 1.21*  --  0.81 0.76 0.72  CALCIUM 9.3  --   --  9.3 9.3 9.7  MG  --   --  2.5*  --   --   --    Liver Function Tests: Recent Labs  Lab 09/17/20 2300 09/19/20 1108 09/20/20 1118 09/21/20 0306  AST 183* 115* 90* 85*  ALT 39 36 29 29  ALKPHOS 269* 234* 195* 218*  BILITOT 1.1 1.3* 1.2 1.2  PROT 7.2 7.3 6.1* 5.9*  ALBUMIN 2.6* 2.4* 2.1* 2.0*   No results for input(s): LIPASE, AMYLASE in the last 168 hours. No results for input(s): AMMONIA in the last 168 hours. CBC: Recent Labs  Lab 09/17/20 2300 09/18/20 0618 09/19/20 0213 09/20/20 1227 09/21/20 0306  WBC 18.7* 12.7* 12.1* 7.9 8.0  NEUTROABS 16.3*  --   --  6.4 6.5  HGB 13.7 10.3* 12.4 12.9 12.4  HCT 42.1 31.5* 37.3 37.8 37.4  MCV 106.6* 106.8* 104.5* 101.9* 105.4*  PLT 153 99* 59* 37* 40*   Cardiac Enzymes: No results for input(s): CKTOTAL, CKMB, CKMBINDEX, TROPONINI in the last 168 hours. BNP: BNP (last 3 results) Recent Labs    09/17/20 2247 09/20/20 1118 09/21/20 0306  BNP 393.9* 928.1* 105.7*    ProBNP (last 3 results) No results for input(s): PROBNP in the last 8760 hours.  CBG: No results for input(s): GLUCAP in the last 168 hours.     Signed:  Florencia Reasons MD, PhD, FACP  [facility] 09/23/2020, 3:05 PM

## 2020-09-23 NOTE — Progress Notes (Signed)
   Referral received from [facility] RN with transition of care for [facility].   Spoke to the pt's daughter reviewed pt chart, discussed with our MD and all in agreement that pt focus is comfort approach and she has been approved for hospice care.   Pt can transfer to facility today.   Report number for nurse to call is [facility]

## 2020-09-23 NOTE — Progress Notes (Signed)
Daily Progress Note   Patient Name: Shannon Garcia       Date: 09/23/2020 DOB: 12-Jun-1966  Age: 54 y.o. MRN#: [id number] Attending Physician: Florencia Reasons, MD Primary Care Physician: Welford Roche, NP Admit Date: 09/17/2020  Reason for Consultation/Follow-up: Establishing goals of care  Subjective: Patient is now on the sixth floor.  She has taken a few steps/bites.  She remains on 7 L of oxygen with oxygen saturations listed as 95%.  Patient is asking why a lot of family members cannot come visit.  Discussed with her about Covid visitor restrictions.  Call placed and discussed with daughter Colletta Maryland.  Medication history reviewed.   Length of Stay: 5  Current Medications: Scheduled Meds:  . budesonide (PULMICORT) nebulizer solution  0.5 mg Nebulization BID  . busPIRone  5 mg Oral BID  . Chlorhexidine Gluconate Cloth  6 each Topical Daily  . feeding supplement  237 mL Oral BID BM  . fluticasone  2 spray Each Nare Daily  . furosemide  40 mg Intravenous Q12H  . guaiFENesin  1,200 mg Oral BID  . ipratropium-albuterol  3 mL Nebulization TID  . loratadine  10 mg Oral Daily  . mouth rinse  15 mL Mouth Rinse BID  . pantoprazole sodium  40 mg Per Tube BID  . rOPINIRole  0.5 mg Oral QHS  . sertraline  100 mg Oral BID    Continuous Infusions: . sodium chloride Stopped (09/22/20 1249)  . ceFEPime (MAXIPIME) IV 2 g (09/23/20 0558)  . vancomycin 1,750 mg (09/22/20 2224)    PRN Meds: sodium chloride, acetaminophen **OR** acetaminophen, guaiFENesin, LORazepam, ondansetron **OR** ondansetron (ZOFRAN) IV, sodium chloride flush  Physical Exam         Appears frail and weak Currently on 7 L [place] Diminished breath sounds Appears with frailty S1-S2 Has no edema Awake but not entirely  alert Appears with generalized weakness  Vital Signs: BP 110/69 (BP Location: Left Arm)   Pulse 80   Temp 97.7 F (36.5 C) (Oral)   Resp 14   Ht 5\' 7"  (1.702 m)   Wt 64.6 kg   LMP  (LMP Unknown)   SpO2 95%   BMI 22.31 kg/m  SpO2: SpO2: 95 % O2 Device: O2 Device: Nasal Cannula O2 Flow Rate: O2 Flow Rate (L/min): 7 L/min  Intake/output  summary:   Intake/Output Summary (Last 24 hours) at 09/23/2020 1246 Last data filed at 09/23/2020 0732 Gross per 24 hour  Intake 387.82 ml  Output 450 ml  Net -62.18 ml   LBM: Last BM Date:  (PTA) Baseline Weight: Weight: 68 kg Most recent weight: Weight: 64.6 kg       Palliative Assessment/Data:      Patient Active Problem List   Diagnosis Date Noted  . Palliative care by specialist   . Goals of care, counseling/discussion   . General weakness   . Community acquired pneumonia of right lung   . Pleural effusion, right 09/18/2020  . Leucocytosis 09/18/2020  . Sepsis ([place]) 09/18/2020  . Acute on chronic respiratory failure with hypoxemia ([place]) 09/18/2020  . Acute respiratory failure with hypoxia ([place]) 09/18/2020  . Anemia, vitamin B12 deficiency 06/26/2020  . Cancer, metastatic to bone ([place]) 06/26/2020  . Malignant neoplasm of upper-outer quadrant of right female breast ([place]) 06/19/2020    Palliative Care Assessment & Plan   Patient Profile:  54 y.o. female admitted on 09/17/2020 with past medical history significant ofmetastatic breast cancer/Dr Hinton Rao in [place]/made decision to stop treatment. depression/daughter reports suicide attempts in the past, chronic pain/home medications to includes Methadone  Admitted through the emergency room with progressive shortness of breath and cough.  Noted anemia and leukocytosis Prior to this admission she was living alone with support from family.  Noted continued physical and functional decline.  Patient was evaluated and found to have a large pleural effusion.  Patient has not been  vaccinated against the Covid.      COVID-19 screen negative.  Patient admitted for further evaluation and treatment.  Assessment:  Acute on chronic respiratory failure with hypoxemia likely secondary to large right-sided pleural effusion/?  Parapneumonic effusion and pneumonia/?  Pulmonary edema/volume overload. Chronic pain syndrome.    Recommendations/Plan:  Call placed and discussed with patient's daughter.  Current medication history reviewed.  Current chest x-ray imaging reviewed.  Patient underwent thoracentesis when she came in.  Serial chest x-rays were done earlier in this hospitalization.  We reviewed those chest x-rays to the best of my interpretation.  We discussed that the patient remains on broad-spectrum antibiotics.  Discussed about concern for patient having a multifactorial etiology such as pneumonia, volume overload as well as strong possibility of lymphangitic spread.  Patient remains intermittently confused in my opinion.  Also wishes and values important to [place] rediscussed.  Colletta Maryland states that she does not want the patient to suffer.  She states she does not want to continue things which will only prolong the inevitable.  We discussed about the main differences between skilled nursing facility/rehab versus comfort care/hospice.  To the best of my ability, we outlined the differences between the two.  Overall, Colletta Maryland continues to believe that patient would best benefit from hospice towards the end of this hospitalization.  She has her children at home with her, her husband is at work and she is trying to find out about current Covid restrictions and Development worker, community. Continue current mode of care for now.    Code Status:    Code Status Orders  (From admission, onward)         Start     Ordered   09/19/20 1508  Do not attempt resuscitation (DNR)  Continuous       Question Answer Comment  In the event of cardiac or respiratory ARREST Do not call a "code blue"    In the event  of cardiac or respiratory ARREST Do not perform Intubation, CPR, defibrillation or ACLS   In the event of cardiac or respiratory ARREST Use medication by any route, position, wound care, and other measures to relive pain and suffering. May use oxygen, suction and manual treatment of airway obstruction as needed for comfort.      09/19/20 1507        Code Status History    Date Active Date Inactive Code Status Order ID Comments User Context   09/18/2020 0403 09/19/2020 1507 Full Code [id number]  Elwyn Reach, MD ED   Advance Care Planning Activity       Prognosis:   guarded.  Likely less than 2 weeks Discharge Planning:  To Be Determined Likely for residential hospice   Care plan was discussed with patient,  patient'sdaughter   Thank you for allowing the Palliative Medicine Team to assist in the care of this patient.   Time In: 12 Time Out: 12.35 Total Time 35 Prolonged Time Billed  no       Greater than 50%  of this time was spent counseling and coordinating care related to the above assessment and plan.  Loistine Chance, MD  Please contact Palliative Medicine Team phone at [phone] for questions and concerns.

## 2020-09-23 NOTE — Progress Notes (Signed)
Pt discharged to residential hospice via [facility]. Report called to Anderson Malta, Therapist, sports at [facility]. No immediate questions or concerns at this time

## 2020-09-28 ENCOUNTER — Telehealth: Payer: Self-pay | Admitting: Oncology

## 2020-09-28 NOTE — Telephone Encounter (Signed)
Per 12/28 Staff Message, please cancel 10/05/20 Appt's.  Patient under [facility]

## 2020-10-01 DIAGNOSIS — C787 Secondary malignant neoplasm of liver and intrahepatic bile duct: Secondary | ICD-10-CM | POA: Diagnosis not present

## 2020-10-01 DIAGNOSIS — Z9981 Dependence on supplemental oxygen: Secondary | ICD-10-CM | POA: Diagnosis not present

## 2020-10-01 DIAGNOSIS — Z9011 Acquired absence of right breast and nipple: Secondary | ICD-10-CM | POA: Diagnosis not present

## 2020-10-01 DIAGNOSIS — G2581 Restless legs syndrome: Secondary | ICD-10-CM | POA: Diagnosis not present

## 2020-10-01 DIAGNOSIS — J910 Malignant pleural effusion: Secondary | ICD-10-CM | POA: Diagnosis not present

## 2020-10-01 DIAGNOSIS — Z853 Personal history of malignant neoplasm of breast: Secondary | ICD-10-CM | POA: Diagnosis not present

## 2020-10-01 DIAGNOSIS — F32A Depression, unspecified: Secondary | ICD-10-CM | POA: Diagnosis not present

## 2020-10-01 DIAGNOSIS — I10 Essential (primary) hypertension: Secondary | ICD-10-CM | POA: Diagnosis not present

## 2020-10-01 DIAGNOSIS — C7951 Secondary malignant neoplasm of bone: Secondary | ICD-10-CM | POA: Diagnosis not present

## 2020-10-01 DIAGNOSIS — E43 Unspecified severe protein-calorie malnutrition: Secondary | ICD-10-CM | POA: Diagnosis not present

## 2020-10-02 DIAGNOSIS — C787 Secondary malignant neoplasm of liver and intrahepatic bile duct: Secondary | ICD-10-CM | POA: Diagnosis not present

## 2020-10-02 DIAGNOSIS — Z9981 Dependence on supplemental oxygen: Secondary | ICD-10-CM | POA: Diagnosis not present

## 2020-10-02 DIAGNOSIS — C7951 Secondary malignant neoplasm of bone: Secondary | ICD-10-CM | POA: Diagnosis not present

## 2020-10-02 DIAGNOSIS — Z853 Personal history of malignant neoplasm of breast: Secondary | ICD-10-CM | POA: Diagnosis not present

## 2020-10-02 DIAGNOSIS — J910 Malignant pleural effusion: Secondary | ICD-10-CM | POA: Diagnosis not present

## 2020-10-02 DIAGNOSIS — Z9011 Acquired absence of right breast and nipple: Secondary | ICD-10-CM | POA: Diagnosis not present

## 2020-10-03 DIAGNOSIS — C7951 Secondary malignant neoplasm of bone: Secondary | ICD-10-CM | POA: Diagnosis not present

## 2020-10-03 DIAGNOSIS — Z853 Personal history of malignant neoplasm of breast: Secondary | ICD-10-CM | POA: Diagnosis not present

## 2020-10-03 DIAGNOSIS — Z9981 Dependence on supplemental oxygen: Secondary | ICD-10-CM | POA: Diagnosis not present

## 2020-10-03 DIAGNOSIS — Z9011 Acquired absence of right breast and nipple: Secondary | ICD-10-CM | POA: Diagnosis not present

## 2020-10-03 DIAGNOSIS — C787 Secondary malignant neoplasm of liver and intrahepatic bile duct: Secondary | ICD-10-CM | POA: Diagnosis not present

## 2020-10-03 DIAGNOSIS — J910 Malignant pleural effusion: Secondary | ICD-10-CM | POA: Diagnosis not present

## 2020-10-04 DIAGNOSIS — C7951 Secondary malignant neoplasm of bone: Secondary | ICD-10-CM | POA: Diagnosis not present

## 2020-10-04 DIAGNOSIS — Z853 Personal history of malignant neoplasm of breast: Secondary | ICD-10-CM | POA: Diagnosis not present

## 2020-10-04 DIAGNOSIS — Z9981 Dependence on supplemental oxygen: Secondary | ICD-10-CM | POA: Diagnosis not present

## 2020-10-04 DIAGNOSIS — C787 Secondary malignant neoplasm of liver and intrahepatic bile duct: Secondary | ICD-10-CM | POA: Diagnosis not present

## 2020-10-04 DIAGNOSIS — Z9011 Acquired absence of right breast and nipple: Secondary | ICD-10-CM | POA: Diagnosis not present

## 2020-10-04 DIAGNOSIS — J910 Malignant pleural effusion: Secondary | ICD-10-CM | POA: Diagnosis not present

## 2020-10-05 ENCOUNTER — Inpatient Hospital Stay: Payer: Medicare Other

## 2020-10-05 ENCOUNTER — Inpatient Hospital Stay: Payer: Medicare Other | Admitting: Oncology

## 2020-11-01 DEATH — deceased

## 2021-08-25 IMAGING — DX DG CHEST 1V PORT
1 series · 1 of 1 positions shown · non-contrast
Comparison: CT 08/03/2020, radiograph 05/09/2020

CLINICAL DATA: Worsening shortness of breath for 2 weeks

EXAM:
PORTABLE CHEST 1 VIEW

[chest ap]
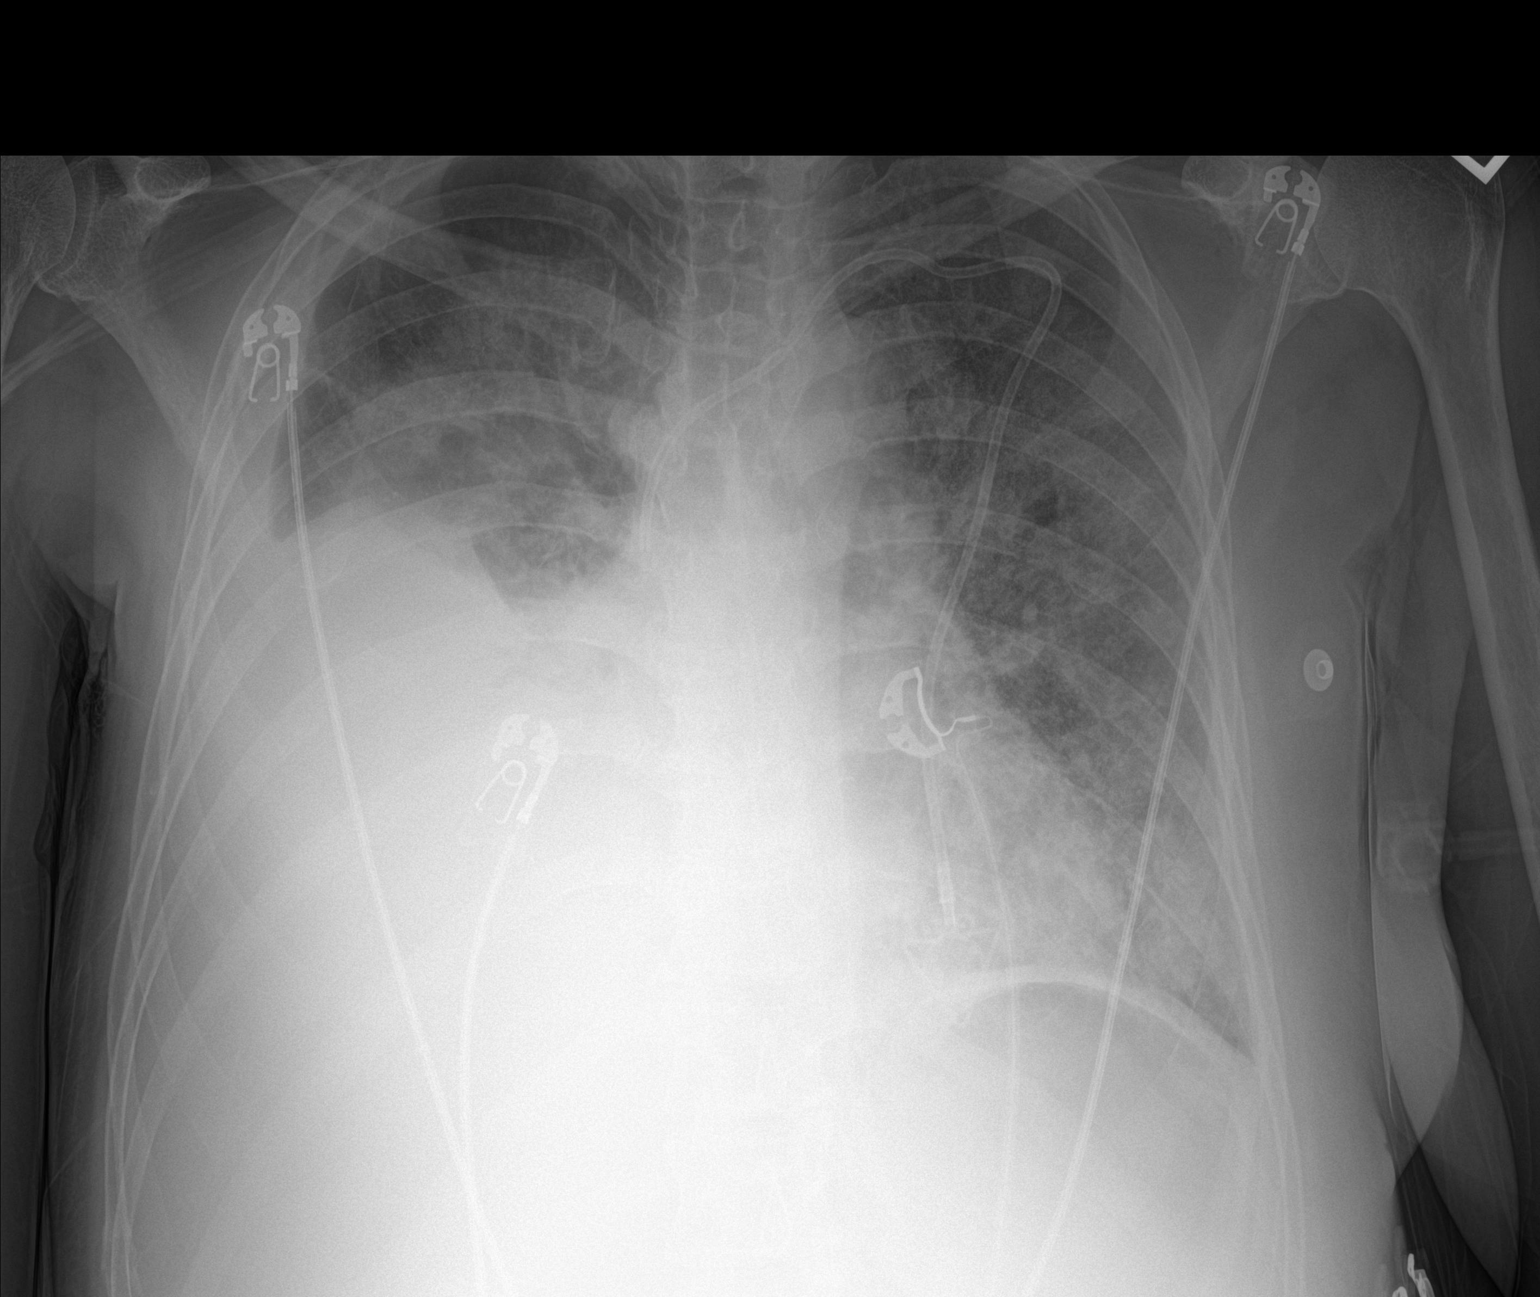

[1 of 1 positions shown; findings below may reference images not displayed]

FINDINGS: Interval development of a large right pleural effusion which tracks
laterally and over the right lung apex. Likely passive atelectatic
change within the regions of adjacent opacity. Some additional
perihilar hazy opacity in the right lung and diffusely throughout
the left lung is noted with vascular congestion. A left subclavian
approach Port-A-Cath is seen in the soft tissues of the left chest
wall with the tip positioned near the superior cavoatrial junction.
Right heart border largely obscured by adjacent opacity. Visible
cardiomediastinal contours are unremarkable. No acute osseous
abnormality. Some edematous changes in the soft tissues.
Degenerative changes are present in the imaged spine and shoulders.
Prior right mastectomy. Telemetry leads overlie the chest.
IMPRESSION: 1. Interval development of a large right pleural effusion which
tracks laterally and over the right lung apex with adjacent areas of
passive atelectatic change
2. Some additional perihilar hazy opacity in the right lung and
diffusely throughout the left lung, likely reflect edema though
underlying infection is difficult to exclude.

## 2021-08-26 IMAGING — DX DG CHEST 1V PORT
1 series · 1 of 1 positions shown · non-contrast
Comparison: Chest radiograph from one day prior.

CLINICAL DATA: Status post right thoracentesis.

EXAM:
PORTABLE CHEST 1 VIEW

[chest ap]
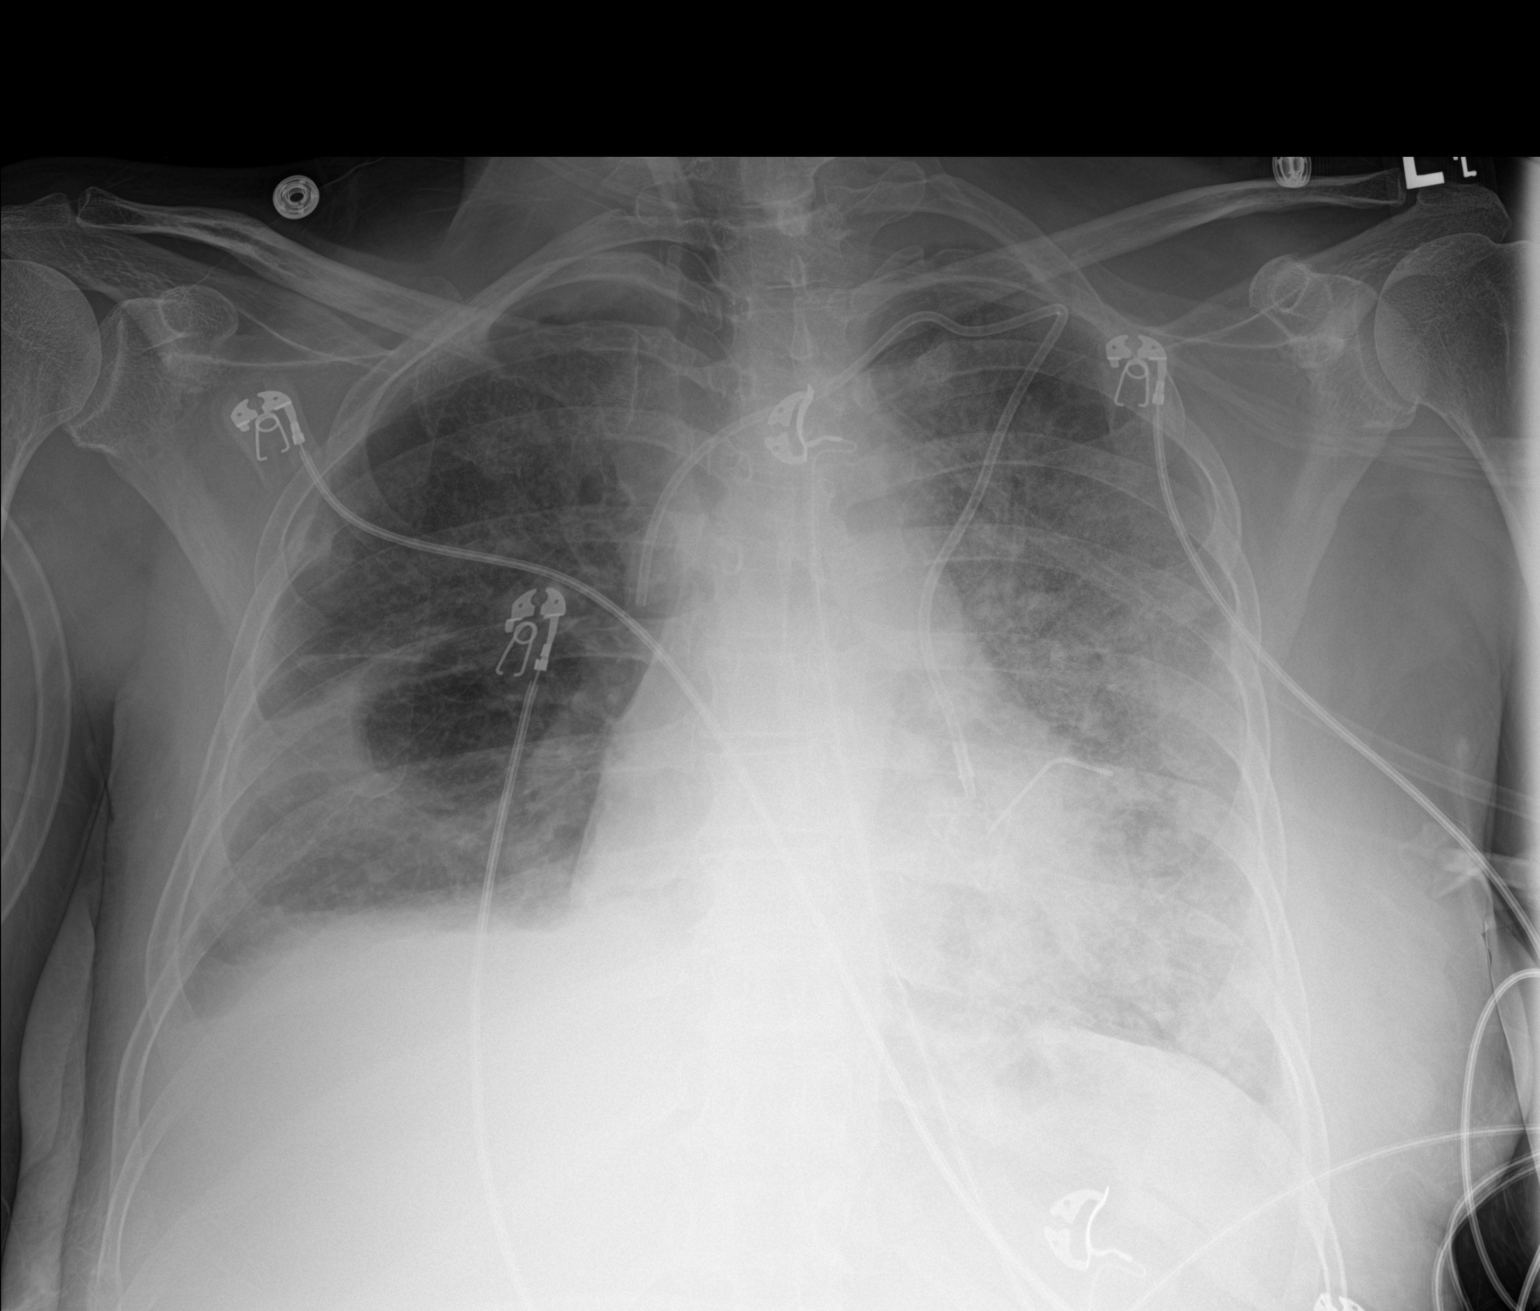

[1 of 1 positions shown; findings below may reference images not displayed]

FINDINGS: Stable left subclavian Port-A-Cath terminating in the middle third
of the SVC. Stable cardiomediastinal silhouette with mild
cardiomegaly. No pneumothorax. Small right pleural effusion,
significantly decreased. Stable small left pleural effusion.
Improved aeration at the right lung base. Persistent diffuse patchy
hazy lung opacities, left greater than right.
IMPRESSION: 1. No pneumothorax. Small right pleural effusion, significantly
decreased.
2. Stable small left pleural effusion.
3. Improved aeration at the right lung base.
4. Persistent diffuse patchy hazy lung opacities, left greater than
right, favoring multilobar pneumonia.

## 2021-08-26 IMAGING — CT CT CHEST W/O CM
2 of 3 series · 15 of 36 positions shown, 18 images · non-contrast
Comparison: August 03, 2020

CLINICAL DATA: Shortness of breath.

EXAM:
CT CHEST WITHOUT CONTRAST
TECHNIQUE: Multidetector CT imaging of the chest was performed following the
standard protocol without IV contrast.

[Series 2: thorax · axial · 0.75mm/px · z∈[+1151,+1397]mm · 12 of 145 slices shown, 15 images]
[im 11/145  mediastinal]
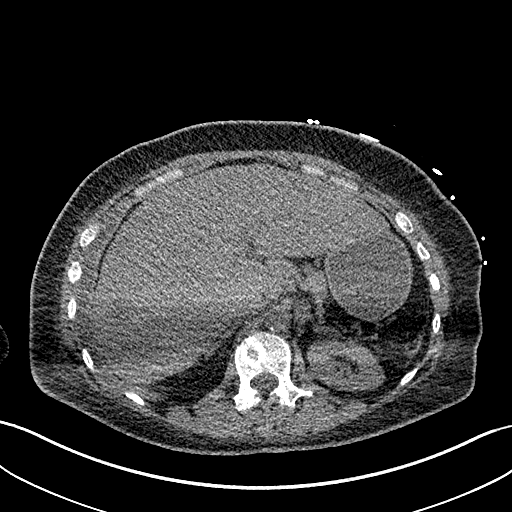
[im 11/145  lung]
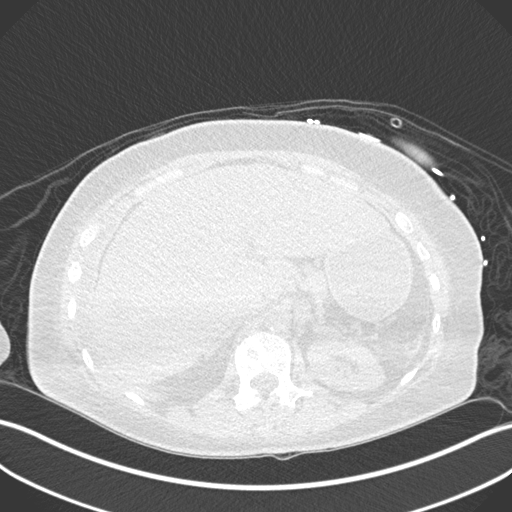
[im 22/145  lung]
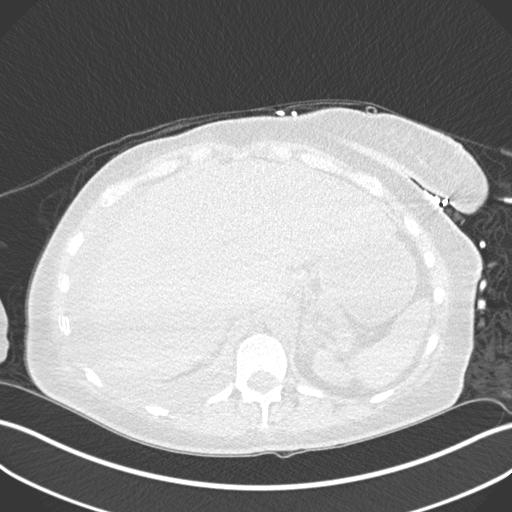
[im 33/145  lung]
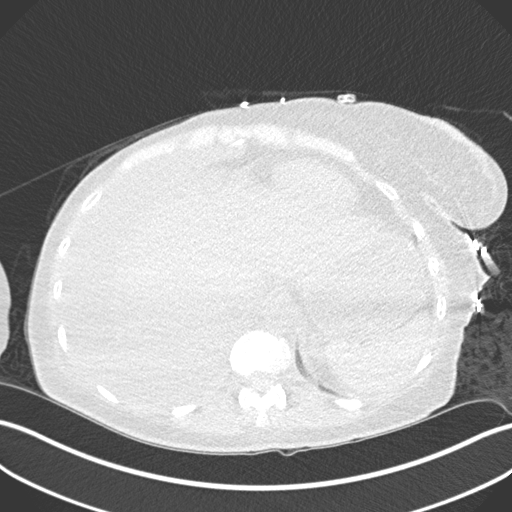
[im 43/145  lung]
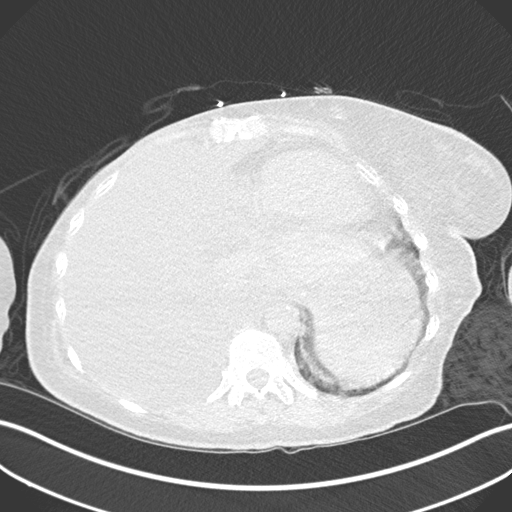
[im 54/145  mediastinal]
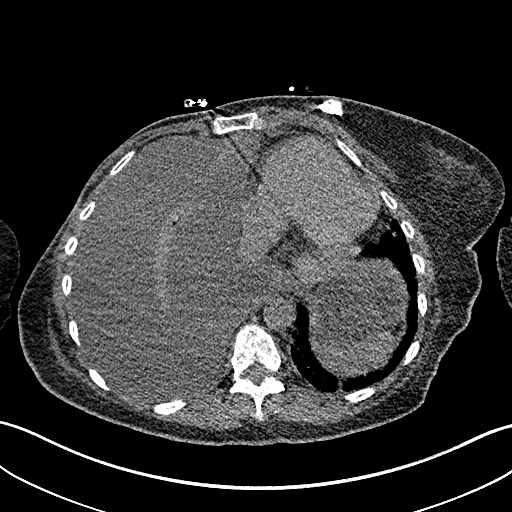
[im 54/145  lung]
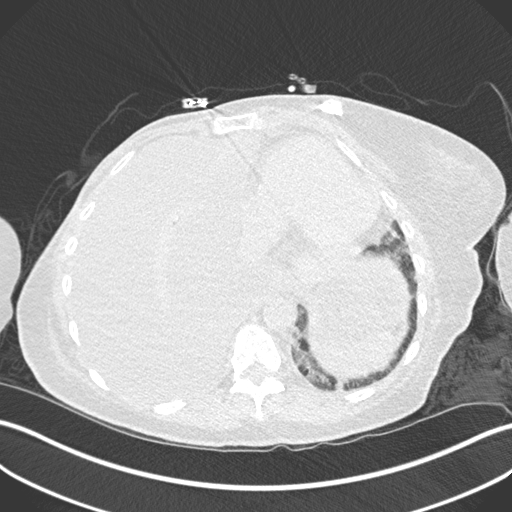
[im 65/145  lung]
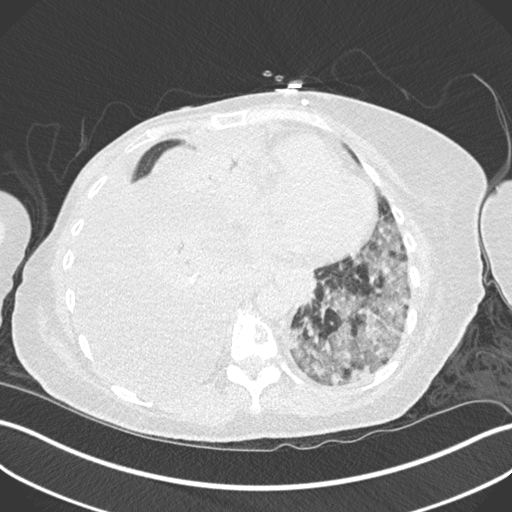
[im 81/145  lung]
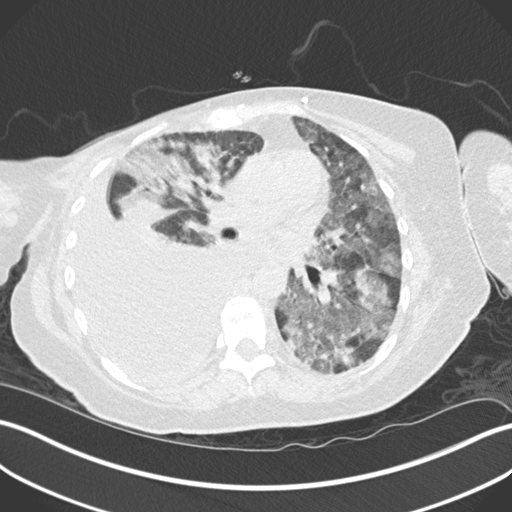
[im 91/145  lung]
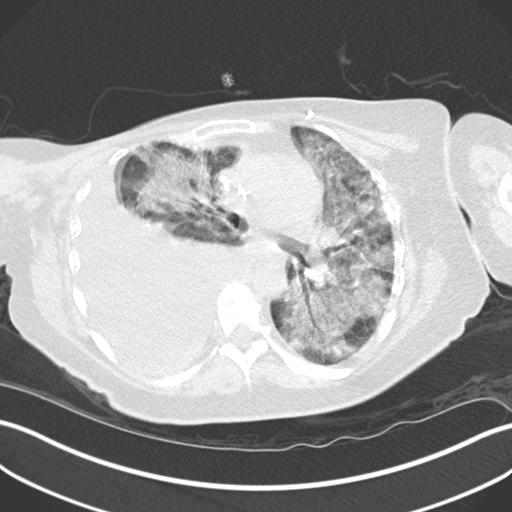
[im 102/145  mediastinal]
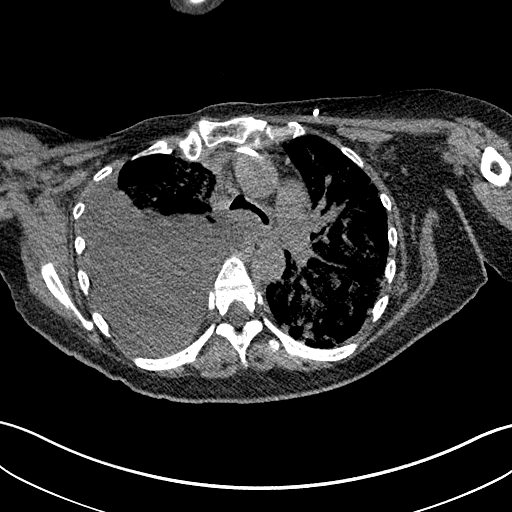
[im 102/145  lung]
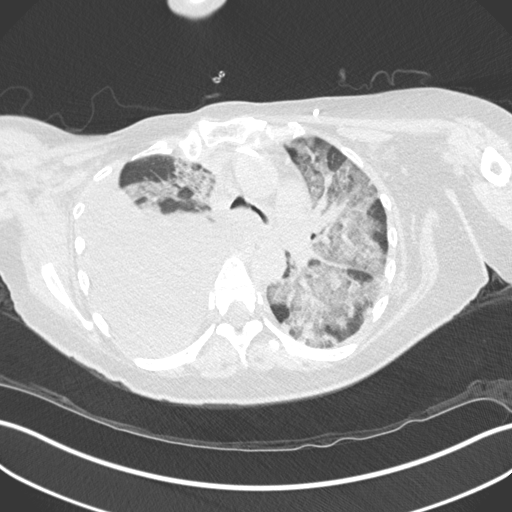
[im 113/145  lung]
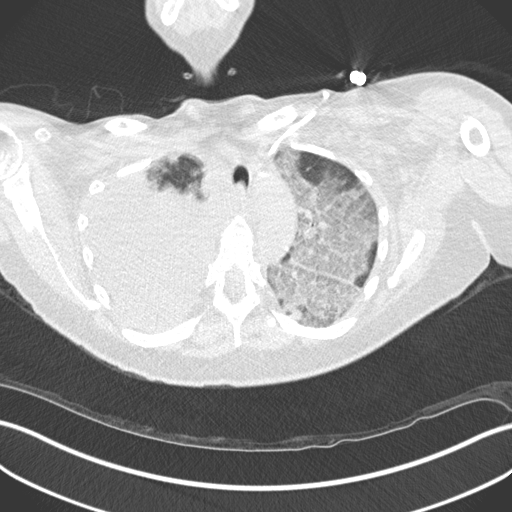
[im 123/145  lung]
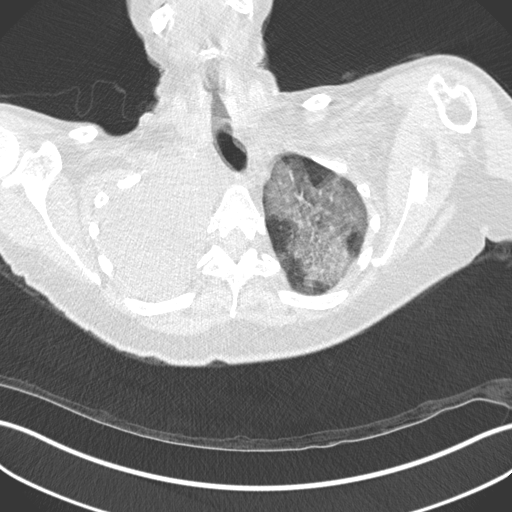
[im 134/145  lung]
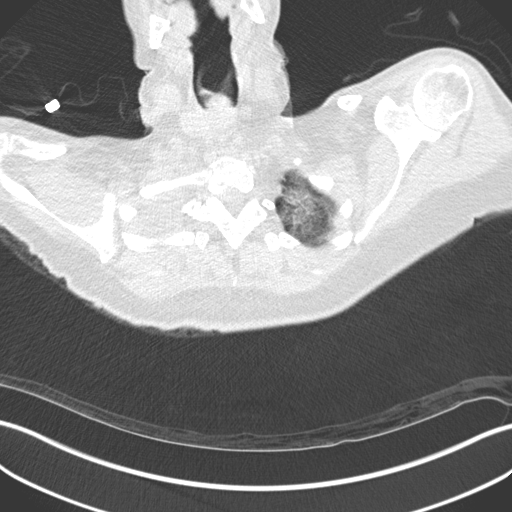

[Series 6: coronal · coronal · 0.58mm/px · 3 of 151 slices shown]
[im 31/151  lung]
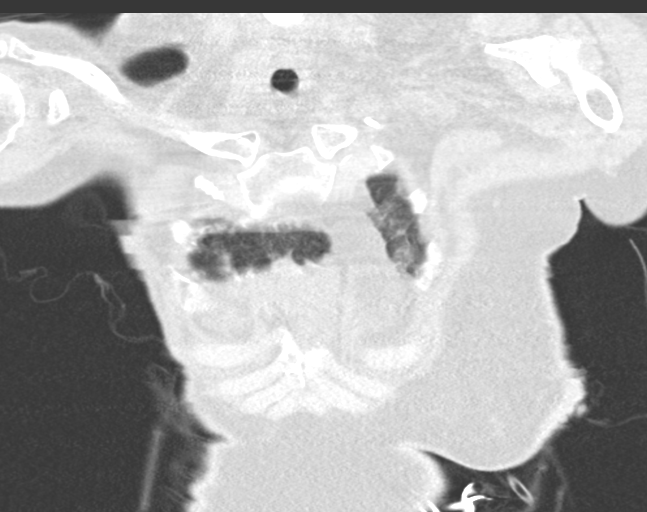
[im 61/151  lung]
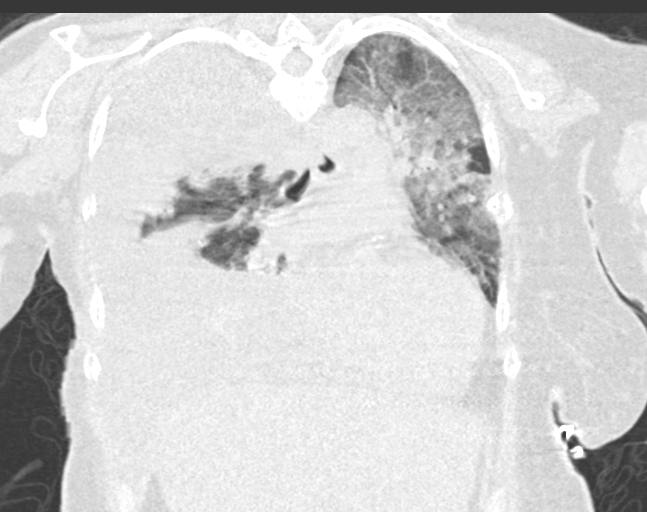
[im 91/151  lung]
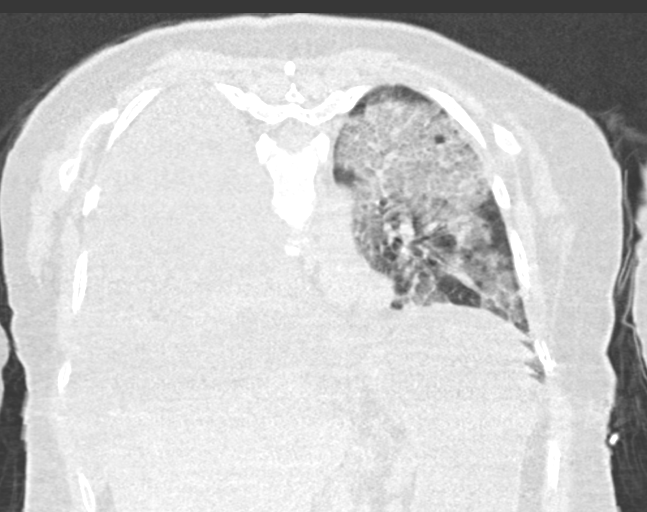

[15 of 36 positions shown; findings below may reference images not displayed]

FINDINGS: Cardiovascular: There is mild calcification of the aortic arch.
Normal heart size with mild to moderate severity coronary artery
calcification. No pericardial effusion.

Mediastinum/Nodes: No enlarged mediastinal or axillary lymph nodes,
however, this is limited in evaluation in the absence of intravenous
contrast. Thyroid gland, trachea, and esophagus demonstrate no
significant findings.

Lungs/Pleura: Marked severity multifocal infiltrates are seen
throughout both lungs.

Moderate to marked severity consolidation is seen within the right
lower lobe.

There is a large right-sided pleural effusion.

No pneumothorax is identified.

Upper Abdomen: No acute abnormality.

Musculoskeletal: There is evidence of prior right-sided mastectomy.

Multiple stable mixed density lesions are again seen within the
thoracic spine.
IMPRESSION: 1. Marked severity bilateral multifocal infiltrates with moderate to
marked severity right lower lobe consolidation.
2. Large right-sided pleural effusion.
3. Findings consistent with osseous metastasis within the thoracic
spine.
4. Evidence of prior right-sided mastectomy.
5. Aortic atherosclerosis.

Aortic Atherosclerosis ([id number]).

## 2021-08-27 IMAGING — DX DG CHEST 1V PORT
1 series · 1 of 1 positions shown · non-contrast
Comparison: September 18, 2020

CLINICAL DATA: Shortness of breath

EXAM:
PORTABLE CHEST 1 VIEW

[chest ap]
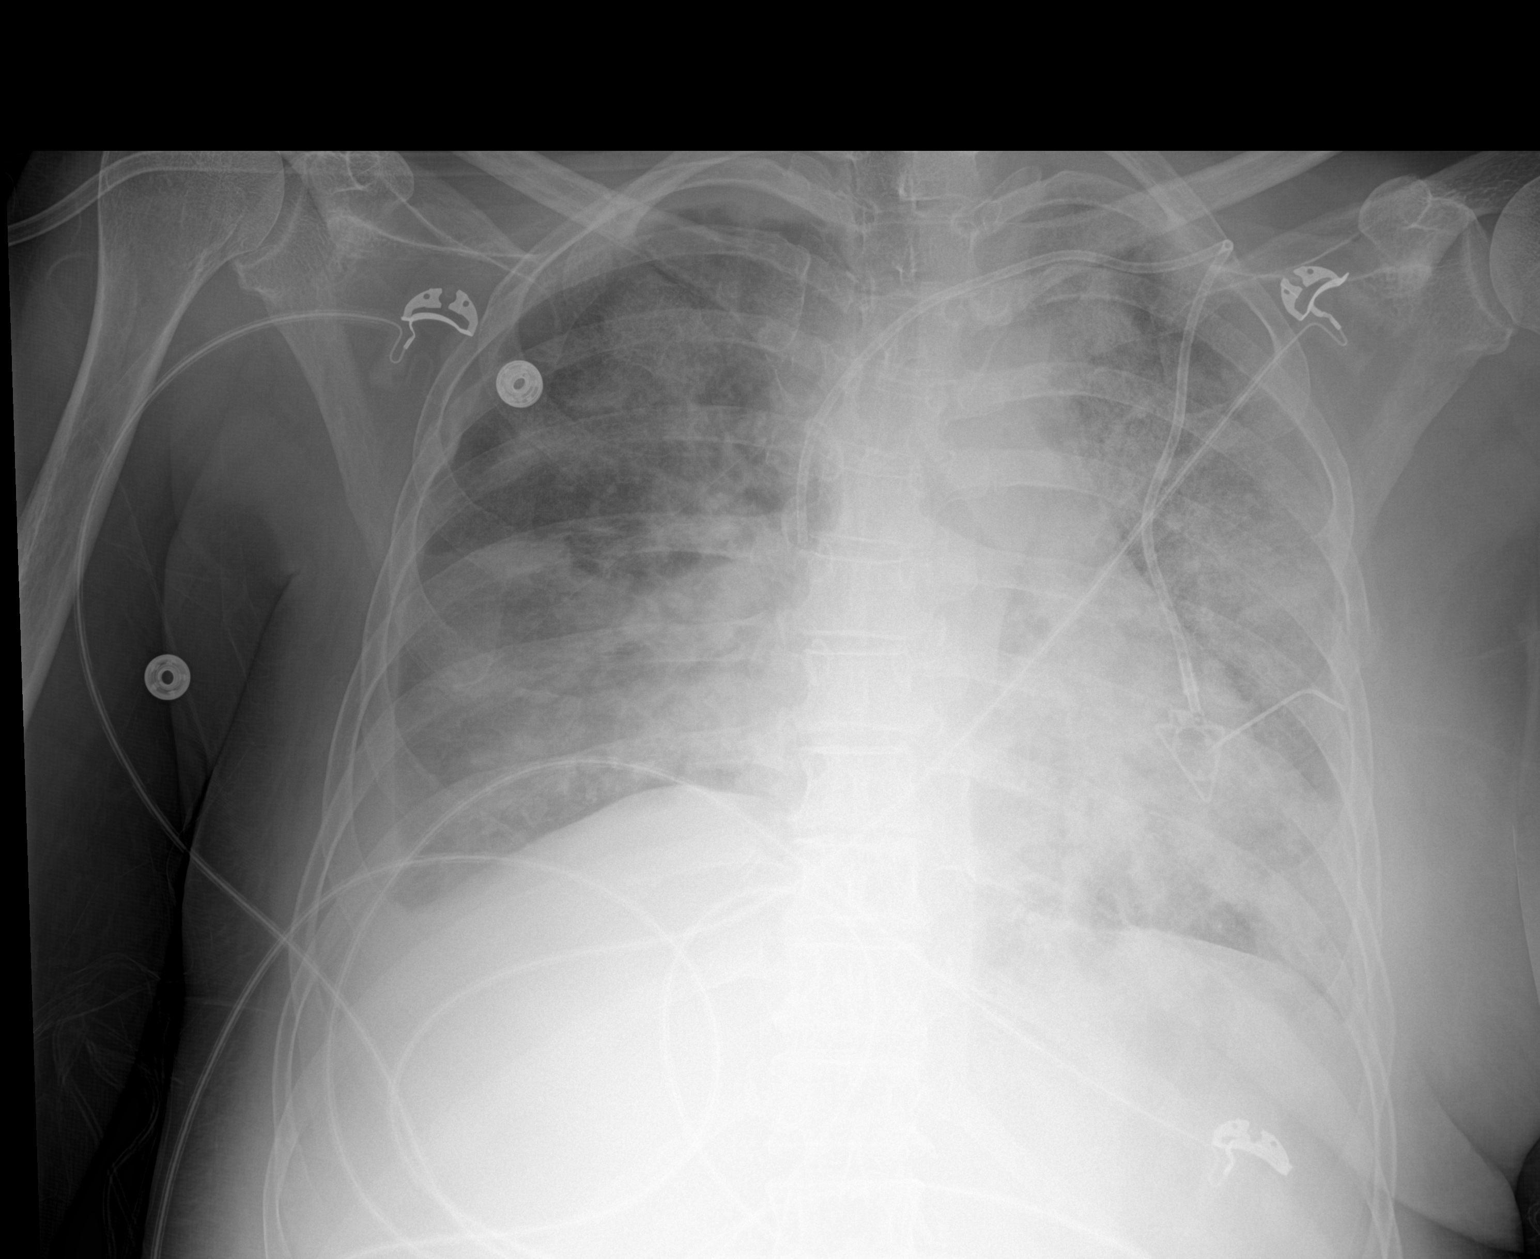

[1 of 1 positions shown; findings below may reference images not displayed]

FINDINGS: Port-A-Cath tip is in the superior vena cava. No pneumothorax. There
is an overall mild increase in diffuse airspace opacity bilaterally.
There is a small right pleural effusion. Heart is upper normal in
size with pulmonary vascularity within normal limits. No adenopathy.
No bone lesions.
IMPRESSION: Small right pleural effusion. Overall increase in airspace opacity
bilaterally which may represent progression of multifocal pneumonia.
A degree of underlying pulmonary edema cannot be excluded. Both
edema and pneumonia may be present concurrently. Stable cardiac
silhouette. Stable Port-A-Cath placement.
